# Patient Record
Sex: Female | Born: 1950 | Race: White | Hispanic: No | Marital: Married | Smoking: Former smoker
Health system: Southern US, Community
[De-identification: ages and names within clinical notes are randomized; demographics above are authoritative.]

## PROBLEM LIST (undated history)

## (undated) DIAGNOSIS — E7800 Pure hypercholesterolemia, unspecified: Secondary | ICD-10-CM

## (undated) DIAGNOSIS — M1990 Unspecified osteoarthritis, unspecified site: Secondary | ICD-10-CM

## (undated) HISTORY — PX: BREAST SURGERY: SHX581

## (undated) HISTORY — PX: TUBAL LIGATION: SHX77

## (undated) HISTORY — DX: Pure hypercholesterolemia, unspecified: E78.00

---

## 2001-01-09 ENCOUNTER — Ambulatory Visit (HOSPITAL_COMMUNITY): Admission: RE | Admit: 2001-01-09 | Discharge: 2001-01-09 | Payer: Self-pay | Admitting: Pulmonary Disease

## 2001-03-28 ENCOUNTER — Ambulatory Visit (HOSPITAL_COMMUNITY): Admission: RE | Admit: 2001-03-28 | Discharge: 2001-03-28 | Payer: Self-pay | Admitting: Pulmonary Disease

## 2001-09-25 ENCOUNTER — Other Ambulatory Visit: Admission: RE | Admit: 2001-09-25 | Discharge: 2001-09-25 | Payer: Self-pay | Admitting: Dermatology

## 2002-02-21 ENCOUNTER — Other Ambulatory Visit: Admission: RE | Admit: 2002-02-21 | Discharge: 2002-02-21 | Payer: Self-pay | Admitting: Obstetrics & Gynecology

## 2002-07-02 ENCOUNTER — Other Ambulatory Visit: Admission: RE | Admit: 2002-07-02 | Discharge: 2002-07-02 | Payer: Self-pay | Admitting: Obstetrics & Gynecology

## 2003-10-14 ENCOUNTER — Other Ambulatory Visit: Admission: RE | Admit: 2003-10-14 | Discharge: 2003-10-14 | Payer: Self-pay | Admitting: Obstetrics & Gynecology

## 2004-11-25 ENCOUNTER — Ambulatory Visit (HOSPITAL_COMMUNITY): Admission: RE | Admit: 2004-11-25 | Discharge: 2004-11-25 | Payer: Self-pay | Admitting: Internal Medicine

## 2004-11-25 ENCOUNTER — Encounter (INDEPENDENT_AMBULATORY_CARE_PROVIDER_SITE_OTHER): Payer: Self-pay | Admitting: Internal Medicine

## 2004-11-25 ENCOUNTER — Ambulatory Visit: Payer: Self-pay | Admitting: Internal Medicine

## 2005-02-10 ENCOUNTER — Ambulatory Visit: Payer: Self-pay | Admitting: Internal Medicine

## 2005-02-16 ENCOUNTER — Ambulatory Visit (HOSPITAL_COMMUNITY): Admission: RE | Admit: 2005-02-16 | Discharge: 2005-02-16 | Payer: Self-pay | Admitting: Internal Medicine

## 2005-04-06 ENCOUNTER — Ambulatory Visit (HOSPITAL_COMMUNITY): Admission: RE | Admit: 2005-04-06 | Discharge: 2005-04-06 | Payer: Self-pay | Admitting: Family Medicine

## 2005-07-16 ENCOUNTER — Ambulatory Visit (HOSPITAL_COMMUNITY): Admission: RE | Admit: 2005-07-16 | Discharge: 2005-07-16 | Payer: Self-pay | Admitting: Obstetrics and Gynecology

## 2005-08-11 ENCOUNTER — Ambulatory Visit (HOSPITAL_COMMUNITY): Admission: RE | Admit: 2005-08-11 | Discharge: 2005-08-11 | Payer: Self-pay | Admitting: Obstetrics and Gynecology

## 2005-09-16 ENCOUNTER — Ambulatory Visit (HOSPITAL_COMMUNITY): Admission: RE | Admit: 2005-09-16 | Discharge: 2005-09-16 | Payer: Self-pay | Admitting: Pulmonary Disease

## 2007-01-05 ENCOUNTER — Ambulatory Visit (HOSPITAL_COMMUNITY): Admission: RE | Admit: 2007-01-05 | Discharge: 2007-01-05 | Payer: Self-pay | Admitting: Obstetrics and Gynecology

## 2007-02-15 ENCOUNTER — Other Ambulatory Visit: Admission: RE | Admit: 2007-02-15 | Discharge: 2007-02-15 | Payer: Self-pay | Admitting: Obstetrics and Gynecology

## 2008-04-10 ENCOUNTER — Ambulatory Visit (HOSPITAL_COMMUNITY): Admission: RE | Admit: 2008-04-10 | Discharge: 2008-04-10 | Payer: Self-pay | Admitting: Obstetrics and Gynecology

## 2008-04-18 ENCOUNTER — Encounter: Admission: RE | Admit: 2008-04-18 | Discharge: 2008-04-18 | Payer: Self-pay | Admitting: Obstetrics and Gynecology

## 2008-04-24 ENCOUNTER — Other Ambulatory Visit: Admission: RE | Admit: 2008-04-24 | Discharge: 2008-04-24 | Payer: Self-pay | Admitting: Obstetrics and Gynecology

## 2009-04-28 ENCOUNTER — Ambulatory Visit (HOSPITAL_COMMUNITY): Admission: RE | Admit: 2009-04-28 | Discharge: 2009-04-28 | Payer: Self-pay | Admitting: Obstetrics and Gynecology

## 2009-05-21 ENCOUNTER — Other Ambulatory Visit: Admission: RE | Admit: 2009-05-21 | Discharge: 2009-05-21 | Payer: Self-pay | Admitting: Obstetrics and Gynecology

## 2010-07-24 NOTE — Op Note (Signed)
[name], [name]                  ACCOUNT NO.:  [id number]   MEDICAL RECORD NO.:  [id number]          PATIENT TYPE:  AMB   LOCATION:  DAY                           FACILITY:  [facility]   PHYSICIAN:  Lionel December, M.D.    DATE OF BIRTH:  01/03/1951   DATE OF PROCEDURE:  11/25/2004  DATE OF DISCHARGE:                                 OPERATIVE REPORT   PROCEDURE:  Colonoscopy.   INDICATIONS:  Nancy Velez is 60 year old Caucasian female who is here for  screening colonoscopy. Family history is negative for colon carcinoma in  first degree relatives.   Procedure and risks were reviewed with the patient, and informed consent was  obtained.   PREMEDICATION:  Demerol 50 mg IV, Versed 6 mg IV in divided dose.   FINDINGS:  Procedure performed in endoscopy suite. The patient's vital signs  and O2 saturation were monitored during the procedure and remained stable.  The patient was placed in left lateral position and rectal examination  performed. No abnormality noted on external or digital exam. Olympus  videoscope was placed in rectum and advanced under vision into sigmoid colon  and beyond. Redundant colon with satisfactory preparation. Scope was passed  to the cecum which was identified by appendiceal orifice and ileocecal  valve. Pictures taken for the record. As the scope was withdrawn, colonic  mucosa was carefully examined. Single small diverticula was noted at sigmoid  colon along with 4-mm polyp which was easily ablated via cold biopsy. Mucosa  of the rest of the sigmoid colon and rectum was normal. Scope was  retroflexed to examine anorectal junction, and small hemorrhoids were noted  below the dentate line. Endoscope was straightened and withdrawn. The  patient tolerated the procedure well.   IMPRESSION:  1.  A 4-mm polyp ablated via cold biopsy from distal sigmoid colon.  2.  Single diverticulum at sigmoid colon and small external hemorrhoids.   RECOMMENDATIONS:  Standard instructions  given. I will be contacting the  patient with biopsy results and further recommendations.      Lionel December, M.D.  Electronically Signed     NR/MEDQ  D:  11/25/2004  T:  11/25/2004  Job:  [id number]

## 2010-10-21 ENCOUNTER — Other Ambulatory Visit (HOSPITAL_COMMUNITY): Payer: Self-pay | Admitting: Family Medicine

## 2010-10-21 DIAGNOSIS — Z139 Encounter for screening, unspecified: Secondary | ICD-10-CM

## 2010-10-27 ENCOUNTER — Ambulatory Visit (HOSPITAL_COMMUNITY)
Admission: RE | Admit: 2010-10-27 | Discharge: 2010-10-27 | Disposition: A | Discharge status: Home / Self Care | Payer: 59 | Source: Ambulatory Visit | Attending: Family Medicine | Admitting: Family Medicine

## 2010-10-27 DIAGNOSIS — Z1231 Encounter for screening mammogram for malignant neoplasm of breast: Secondary | ICD-10-CM | POA: Insufficient documentation

## 2010-10-27 DIAGNOSIS — Z139 Encounter for screening, unspecified: Secondary | ICD-10-CM

## 2010-10-30 ENCOUNTER — Other Ambulatory Visit: Payer: Self-pay | Admitting: Family Medicine

## 2010-10-30 DIAGNOSIS — R928 Other abnormal and inconclusive findings on diagnostic imaging of breast: Secondary | ICD-10-CM

## 2010-11-03 ENCOUNTER — Other Ambulatory Visit: Payer: Self-pay | Admitting: Family Medicine

## 2010-11-03 DIAGNOSIS — R928 Other abnormal and inconclusive findings on diagnostic imaging of breast: Secondary | ICD-10-CM

## 2010-11-05 ENCOUNTER — Ambulatory Visit
Admission: RE | Admit: 2010-11-05 | Discharge: 2010-11-05 | Disposition: A | Discharge status: Home / Self Care | Payer: 59 | Source: Ambulatory Visit | Attending: Family Medicine | Admitting: Family Medicine

## 2010-11-05 DIAGNOSIS — R928 Other abnormal and inconclusive findings on diagnostic imaging of breast: Secondary | ICD-10-CM

## 2011-04-08 ENCOUNTER — Other Ambulatory Visit: Payer: Self-pay | Admitting: Obstetrics and Gynecology

## 2011-04-09 ENCOUNTER — Other Ambulatory Visit: Payer: Self-pay | Admitting: Obstetrics & Gynecology

## 2011-04-09 DIAGNOSIS — N63 Unspecified lump in unspecified breast: Secondary | ICD-10-CM

## 2011-04-15 ENCOUNTER — Ambulatory Visit
Admission: RE | Admit: 2011-04-15 | Discharge: 2011-04-15 | Disposition: A | Discharge status: Home / Self Care | Payer: 59 | Source: Ambulatory Visit | Attending: Obstetrics & Gynecology | Admitting: Obstetrics & Gynecology

## 2011-04-15 ENCOUNTER — Other Ambulatory Visit: Payer: Self-pay | Admitting: Obstetrics & Gynecology

## 2011-04-15 DIAGNOSIS — N63 Unspecified lump in unspecified breast: Secondary | ICD-10-CM

## 2011-05-26 ENCOUNTER — Other Ambulatory Visit (HOSPITAL_COMMUNITY)
Admission: RE | Admit: 2011-05-26 | Discharge: 2011-05-26 | Disposition: A | Discharge status: Home / Self Care | Payer: 59 | Source: Ambulatory Visit | Attending: Obstetrics and Gynecology | Admitting: Obstetrics and Gynecology

## 2011-05-26 ENCOUNTER — Other Ambulatory Visit: Payer: Self-pay | Admitting: Adult Health

## 2011-05-26 DIAGNOSIS — Z01419 Encounter for gynecological examination (general) (routine) without abnormal findings: Secondary | ICD-10-CM | POA: Insufficient documentation

## 2013-02-14 ENCOUNTER — Telehealth: Payer: Self-pay

## 2013-02-14 NOTE — Telephone Encounter (Signed)
I reviewed pt's paper chart and did not see what her blood type was. Advised when she had labs drawn next time, to have it checked then. Pt voiced understanding. JSY

## 2013-03-21 ENCOUNTER — Other Ambulatory Visit: Payer: Self-pay | Admitting: Adult Health

## 2013-03-28 ENCOUNTER — Other Ambulatory Visit (HOSPITAL_COMMUNITY)
Admission: RE | Admit: 2013-03-28 | Discharge: 2013-03-28 | Disposition: A | Discharge status: Home / Self Care | Payer: 59 | Source: Ambulatory Visit | Attending: Family Medicine | Admitting: Family Medicine

## 2013-03-28 ENCOUNTER — Ambulatory Visit (INDEPENDENT_AMBULATORY_CARE_PROVIDER_SITE_OTHER): Payer: 59 | Admitting: Adult Health

## 2013-03-28 ENCOUNTER — Encounter: Payer: Self-pay | Admitting: Adult Health

## 2013-03-28 VITALS — BP 132/70 | HR 72 | Ht 62.5 in | Wt 182.0 lb

## 2013-03-28 DIAGNOSIS — Z01419 Encounter for gynecological examination (general) (routine) without abnormal findings: Secondary | ICD-10-CM | POA: Insufficient documentation

## 2013-03-28 DIAGNOSIS — Z1212 Encounter for screening for malignant neoplasm of rectum: Secondary | ICD-10-CM

## 2013-03-28 DIAGNOSIS — Z1151 Encounter for screening for human papillomavirus (HPV): Secondary | ICD-10-CM | POA: Insufficient documentation

## 2013-03-28 DIAGNOSIS — E669 Obesity, unspecified: Secondary | ICD-10-CM | POA: Insufficient documentation

## 2013-03-28 LAB — HEMOCCULT GUIAC POC 1CARD (OFFICE): FECAL OCCULT BLD: NEGATIVE

## 2013-03-28 NOTE — Progress Notes (Signed)
Patient ID: Nancy Velez, female   DOB: 12-Oct-1950, 63 y.o.   MRN: [id number] History of Present Illness: Nancy Velez is a 63 year old white female, married,in for a pap and physical.No complaints, she does Zumba 2-3 x per week and is very active.Has gotten flu shot and pneumonia shot.   Current Medications, Allergies, Past Medical History, Past Surgical History, Family History and Social History were reviewed in [employer] record.     Review of Systems: Patient denies any headaches, blurred vision, shortness of breath, chest pain, abdominal pain, problems with bowel movements, urination, or intercourse. No joint pain or mood swings.    Physical Exam:BP 132/70  Pulse 72  Ht 5' 2.5" (1.588 m)  Wt 182 lb (82.555 kg)  BMI 32.74 kg/m2 General:  Well developed, well nourished, no acute distress Skin:  Warm and dry Neck:  Midline trachea, normal thyroid, no carotid bruits heard Lungs; Clear to auscultation bilaterally Breast:  No dominant palpable mass, retraction, or nipple discharge Cardiovascular: Regular rate and rhythm Abdomen:  Soft, non tender, no hepatosplenomegaly Pelvic:  External genitalia is normal in appearance.  The vagina is normal in appearance for age.               The cervix is smooth and atrophic, pap with HPV performed.  Uterus is felt to be normal size, shape, and contour.  No adnexal masses or tenderness noted. Rectal: Good sphincter tone, no polyps, or hemorrhoids felt.  Hemoccult negative. Extremities:  No swelling or varicosities noted Psych:  No mood changes, alert and cooperative, seems happy   Impression: Yearly gyn exam    Plan: Physical in 2 years Mammogram yearly  Colonoscopy per GI Labs next week, CBC,CMP,TSH and lipids

## 2013-03-28 NOTE — Patient Instructions (Signed)
Physical in 2 year Mammogram  Yearly  colonoscopy per GI Labs nest week

## 2013-04-04 ENCOUNTER — Other Ambulatory Visit: Payer: 59

## 2013-04-04 ENCOUNTER — Telehealth: Payer: Self-pay | Admitting: *Deleted

## 2013-04-04 DIAGNOSIS — Z13220 Encounter for screening for lipoid disorders: Secondary | ICD-10-CM

## 2013-04-04 DIAGNOSIS — Z1329 Encounter for screening for other suspected endocrine disorder: Secondary | ICD-10-CM

## 2013-04-04 DIAGNOSIS — Z0000 Encounter for general adult medical examination without abnormal findings: Secondary | ICD-10-CM

## 2013-04-04 LAB — COMPREHENSIVE METABOLIC PANEL
ALBUMIN: 4.2 g/dL (ref 3.5–5.2)
ALK PHOS: 86 U/L (ref 39–117)
ALT: 28 U/L (ref 0–35)
AST: 20 U/L (ref 0–37)
BILIRUBIN TOTAL: 0.5 mg/dL (ref 0.2–1.2)
BUN: 15 mg/dL (ref 6–23)
CHLORIDE: 101 meq/L (ref 96–112)
CO2: 32 mEq/L (ref 19–32)
Calcium: 9.8 mg/dL (ref 8.4–10.5)
Creat: 0.62 mg/dL (ref 0.50–1.10)
Glucose, Bld: 91 mg/dL (ref 70–99)
Potassium: 5.4 mEq/L — ABNORMAL HIGH (ref 3.5–5.3)
SODIUM: 141 meq/L (ref 135–145)
Total Protein: 7.3 g/dL (ref 6.0–8.3)

## 2013-04-04 LAB — CBC
HEMATOCRIT: 41.4 % (ref 36.0–46.0)
HEMOGLOBIN: 14 g/dL (ref 12.0–15.0)
MCH: 29.8 pg (ref 26.0–34.0)
MCHC: 33.8 g/dL (ref 30.0–36.0)
MCV: 88.1 fL (ref 78.0–100.0)
PLATELETS: 378 10*3/uL (ref 150–400)
RBC: 4.7 MIL/uL (ref 3.87–5.11)
RDW: 14.7 % (ref 11.5–15.5)
WBC: 6.7 10*3/uL (ref 4.0–10.5)

## 2013-04-04 LAB — LIPID PANEL
CHOLESTEROL: 260 mg/dL — AB (ref 0–200)
HDL: 85 mg/dL (ref >39)
LDL Cholesterol: 157 mg/dL — ABNORMAL HIGH (ref 0–99)
Total CHOL/HDL Ratio: 3.1 Ratio
Triglycerides: 91 mg/dL (ref <150)
VLDL: 18 mg/dL (ref 0–40)

## 2013-04-04 LAB — TSH: TSH: 2.982 u[IU]/mL (ref 0.350–4.500)

## 2013-04-05 ENCOUNTER — Telehealth: Payer: Self-pay | Admitting: Adult Health

## 2013-04-05 NOTE — Telephone Encounter (Signed)
Pt aware of labs, stay active watch diet.will mail copy to pt.

## 2013-04-09 ENCOUNTER — Other Ambulatory Visit: Payer: Self-pay

## 2013-04-09 DIAGNOSIS — Z9889 Other specified postprocedural states: Secondary | ICD-10-CM

## 2013-04-09 DIAGNOSIS — Z1231 Encounter for screening mammogram for malignant neoplasm of breast: Secondary | ICD-10-CM

## 2013-04-10 NOTE — Telephone Encounter (Signed)
Labs mailed per front staff.

## 2013-04-19 ENCOUNTER — Encounter: Payer: Self-pay | Admitting: Cardiovascular Disease

## 2013-04-19 ENCOUNTER — Ambulatory Visit (INDEPENDENT_AMBULATORY_CARE_PROVIDER_SITE_OTHER): Payer: 59 | Admitting: Cardiovascular Disease

## 2013-04-19 VITALS — BP 126/80 | HR 100 | Ht 63.0 in | Wt 183.0 lb

## 2013-04-19 DIAGNOSIS — R0781 Pleurodynia: Secondary | ICD-10-CM

## 2013-04-19 DIAGNOSIS — R0789 Other chest pain: Secondary | ICD-10-CM

## 2013-04-19 DIAGNOSIS — E7800 Pure hypercholesterolemia, unspecified: Secondary | ICD-10-CM

## 2013-04-19 DIAGNOSIS — R071 Chest pain on breathing: Secondary | ICD-10-CM

## 2013-04-19 HISTORY — DX: Pure hypercholesterolemia, unspecified: E78.00

## 2013-04-19 NOTE — Patient Instructions (Signed)
Dr. Sallyanne Kuster has requested that you have an echocardiogram. Echocardiography is a painless test that uses sound waves to create images of your heart. It provides your doctor with information about the size and shape of your heart and how well your heart's chambers and valves are working. This procedure takes approximately one hour. There are no restrictions for this procedure.  Dr. Sallyanne Kuster has requested that you have an exercise tolerance test. For further information please visit [email or web address]. Please also follow instruction sheet, as given.  We will call you with your test results.

## 2013-04-19 NOTE — Progress Notes (Signed)
Patient ID: Nancy Velez, female   DOB: [birth year], 63 y.o.   MRN: [id number]      Reason for office visit Chest pain  Nancy Velez is married to Nancy Velez, who is also one of our patients. She has not had any previous cardiac problems. Last week she experienced an episode of chest discomfort that lasted for at least an hour. She describes as a simultaneously sharp and heavy sensation underneath her left breast. It was distinctly worsened by taking a deep breath. She has been under a lot of psychological stress as her mother has been diagnosed with lung cancer and she tried to take an anti-anxiety medication but this did not help. Eventually she "slept it off". She has not had any more chest discomfort. Typically she is physically very active and participates in is in the classes several days a week. She appears very muscular and energetic although she is also obese.  The chest discomfort was not associated with fever or chills, cough, hemoptysis, lower extremity swelling or tenderness, flulike symptoms or any other complaints. She has not had palpitations or syncope.  She recently discovered that she has elevated cholesterol and LDL cholesterol, but does not have other coronary risk factors     Allergies  Allergen Reactions  . Shellfish Allergy Nausea And Vomiting    Current Outpatient Prescriptions  Medication Sig Dispense Refill  . ibuprofen (ADVIL,MOTRIN) 200 MG tablet Take 200 mg by mouth as needed.       No current facility-administered medications for this visit.    No past medical history problems  Past Surgical History  Procedure Laterality Date  . Breast surgery      lumpectomy  . Tubal ligation      Family History  Problem Relation Age of Onset  . Cancer Mother     lung  . Cancer Father     prostate  . Cancer Maternal Aunt     brain  . Heart disease Maternal Uncle   . Cancer Maternal Grandmother     pancreatic   . Heart disease Maternal Grandfather   . Heart  disease Paternal Grandmother     heart attack  . Heart disease Paternal Grandfather   . Heart disease Maternal Uncle   . Heart disease Maternal Aunt   . Heart disease Maternal Aunt     History   Social History  . Marital Status: Married    Spouse Name: N/A    Number of Children: N/A  . Years of Education: N/A   Occupational History  . Not on file.   Social History Main Topics  . Smoking status: Former Research scientist (life sciences)  . Smokeless tobacco: Never Used  . Alcohol Use: Yes     Comment: wine occassionally  . Drug Use: No  . Sexual Activity: Yes    Birth Control/ Protection: Post-menopausal, Surgical   Other Topics Concern  . Not on file   Social History Narrative  . No narrative on file    Review of systems: The patient specifically denies dyspnea at rest or with exertion, orthopnea, paroxysmal nocturnal dyspnea, syncope, palpitations, focal neurological deficits, intermittent claudication, lower extremity edema, unexplained weight gain, cough, hemoptysis or wheezing.  The patient also denies abdominal pain, nausea, vomiting, dysphagia, diarrhea, constipation, polyuria, polydipsia, dysuria, hematuria, frequency, urgency, abnormal bleeding or bruising, fever, chills, unexpected weight changes, mood swings, change in skin or hair texture, change in voice quality, auditory or visual problems, allergic reactions or rashes, new musculoskeletal complaints other than usual "  aches and pains".    PHYSICAL EXAM BP 126/80  Pulse 100  Ht 5\' 3"  (1.6 m)  Wt 83.008 kg (183 lb)  BMI 32.43 kg/m2  General: Alert, oriented x3, no distress Head: no evidence of trauma, PERRL, EOMI, no exophtalmos or lid lag, no myxedema, no xanthelasma; normal ears, nose and oropharynx Neck: normal jugular venous pulsations and no hepatojugular reflux; brisk carotid pulses without delay and no carotid bruits Chest: clear to auscultation, no signs of consolidation by percussion or palpation, normal fremitus,  symmetrical and full respiratory excursions Cardiovascular: normal position and quality of the apical impulse, regular rhythm, normal first and second heart sounds, no murmurs, rubs or gallops Abdomen: no tenderness or distention, no masses by palpation, no abnormal pulsatility or arterial bruits, normal bowel sounds, no hepatosplenomegaly Extremities: no clubbing, cyanosis or edema; 2+ radial, ulnar and brachial pulses bilaterally; 2+ right femoral, posterior tibial and dorsalis pedis pulses; 2+ left femoral, posterior tibial and dorsalis pedis pulses; no subclavian or femoral bruits Neurological: grossly nonfocal   EKG: Normal sinus rhythm, possible right atrial abnormality, normal QRS voltage in axis, delayed R wave progression across the anterior precordium, there is an S wave in lead 1 but there is no Q wave in lead 3  Lipid Panel     Component Value Date/Time   CHOL 260* 04/04/2013 0915   TRIG 91 04/04/2013 0915   HDL 85 04/04/2013 0915   CHOLHDL 3.1 04/04/2013 0915   VLDL 18 04/04/2013 0915   LDLCALC 157* 04/04/2013 0915    BMET    Component Value Date/Time   NA 141 04/04/2013 0915   K 5.4* 04/04/2013 0915   CL 101 04/04/2013 0915   CO2 32 04/04/2013 0915   GLUCOSE 91 04/04/2013 0915   BUN 15 04/04/2013 0915   CREATININE 0.62 04/04/2013 0915   CALCIUM 9.8 04/04/2013 0915     ASSESSMENT AND PLAN Pleuritic chest pain Her chest pain does not sound coronary in etiology. I would be more concerned about pulmonary infarction, pneumonia, pericarditis, but there is very little to support any of these diagnoses by either history or physical exam. A week has passed since her symptoms happened and now not sure that a chest x-ray makes much sense at this point, especially since chest x-ray findings and those orders are typically very nonspecific. I am intrigued by the presence of a prominent P-wave on the electrocardiogram in lead 2 and lead aVF. This raises the possibility of right heart strain. I  have recommended that she have an echocardiogram. If right heart enlargement is confirmed I would pursue workup for pulmonary hypertension/venous thromboembolic disease. Even though the symptoms would be highly atypical, in view of her hypercholesterolemia and age it is very reasonable to perform a treadmill stress test. After those 2 tests are performed we will reevaluate the need for any additional workup. If they are normal and her symptoms have completely resolved I'm not sure of go any further.  Hypercholesterolemia Her cholesterol and especially her LDL cholesterol quite elevated. She will require at least lifestyle changes and a reevaluation in a few months. She is very close to the range mood recommend statin therapy given the absence of known vascular disease. If her stress test is abnormal and there is any evidence of vascular disease, I would not hesitate to start a statin now.   Orders Placed This Encounter  Procedures  . EKG 12-Lead  . Exercise Tolerance Test  . 2D Echocardiogram without contrast   Patient  Instructions  Dr. Sallyanne Kuster has requested that you have an echocardiogram. Echocardiography is a painless test that uses sound waves to create images of your heart. It provides your doctor with information about the size and shape of your heart and how well your heart's chambers and valves are working. This procedure takes approximately one hour. There are no restrictions for this procedure.  Dr. Sallyanne Kuster has requested that you have an exercise tolerance test. For further information please visit [email or web address]. Please also follow instruction sheet, as given.  We will call you with your test results.     Holli Humbles, MD, [place] [phone] office [phone] pager

## 2013-04-19 NOTE — Assessment & Plan Note (Signed)
Her chest pain does not sound coronary in etiology. I would be more concerned about pulmonary infarction, pneumonia, pericarditis, but there is very little to support any of these diagnoses by either history or physical exam. A week has passed since her symptoms happened and now not sure that a chest x-ray makes much sense at this point, especially since chest x-ray findings and those orders are typically very nonspecific. I am intrigued by the presence of a prominent P-wave on the electrocardiogram in lead 2 and lead aVF. This raises the possibility of right heart strain. I have recommended that she have an echocardiogram. If right heart enlargement is confirmed I would pursue workup for pulmonary hypertension/venous thromboembolic disease. Even though the symptoms would be highly atypical, in view of her hypercholesterolemia and age it is very reasonable to perform a treadmill stress test. After those 2 tests are performed we will reevaluate the need for any additional workup. If they are normal and her symptoms have completely resolved I'm not sure of go any further.

## 2013-04-19 NOTE — Assessment & Plan Note (Signed)
Her cholesterol and especially her LDL cholesterol quite elevated. She will require at least lifestyle changes and a reevaluation in a few months. She is very close to the range mood recommend statin therapy given the absence of known vascular disease. If her stress test is abnormal and there is any evidence of vascular disease, I would not hesitate to start a statin now.

## 2013-04-20 ENCOUNTER — Ambulatory Visit
Admission: RE | Admit: 2013-04-20 | Discharge: 2013-04-20 | Disposition: A | Discharge status: Home / Self Care | Payer: No Typology Code available for payment source | Source: Ambulatory Visit

## 2013-04-20 ENCOUNTER — Other Ambulatory Visit: Payer: Self-pay

## 2013-04-20 DIAGNOSIS — Z1231 Encounter for screening mammogram for malignant neoplasm of breast: Secondary | ICD-10-CM

## 2013-04-20 DIAGNOSIS — Z9889 Other specified postprocedural states: Secondary | ICD-10-CM

## 2013-04-27 ENCOUNTER — Encounter: Payer: Self-pay | Admitting: Cardiovascular Disease

## 2013-05-03 ENCOUNTER — Encounter (HOSPITAL_COMMUNITY): Payer: 59

## 2013-05-07 ENCOUNTER — Ambulatory Visit (HOSPITAL_COMMUNITY)
Admission: RE | Admit: 2013-05-07 | Discharge: 2013-05-07 | Disposition: A | Discharge status: Home / Self Care | Payer: 59 | Source: Ambulatory Visit | Attending: Internal Medicine | Admitting: Internal Medicine

## 2013-05-07 DIAGNOSIS — R0789 Other chest pain: Secondary | ICD-10-CM | POA: Insufficient documentation

## 2013-05-07 DIAGNOSIS — R072 Precordial pain: Secondary | ICD-10-CM

## 2013-05-07 NOTE — Progress Notes (Signed)
2D Echo Performed 05/07/2013    Destiney Sanabia, RCS  

## 2013-05-17 ENCOUNTER — Ambulatory Visit (HOSPITAL_COMMUNITY)
Admission: RE | Admit: 2013-05-17 | Discharge: 2013-05-17 | Disposition: A | Discharge status: Home / Self Care | Payer: 59 | Source: Ambulatory Visit | Attending: Cardiovascular Disease | Admitting: Cardiovascular Disease

## 2013-05-17 DIAGNOSIS — R0789 Other chest pain: Secondary | ICD-10-CM

## 2013-05-17 DIAGNOSIS — R079 Chest pain, unspecified: Secondary | ICD-10-CM | POA: Insufficient documentation

## 2013-05-18 ENCOUNTER — Telehealth: Payer: Self-pay | Admitting: *Deleted

## 2013-05-18 NOTE — Telephone Encounter (Signed)
Normal stress test results called to patient.  Voiced understanding.

## 2013-05-18 NOTE — Telephone Encounter (Signed)
Message copied by Tressa Busman on Fri May 18, 2013  2:56 PM ------      Message from: Sanda Klein      Created: Fri May 18, 2013  9:51 AM       Normal stress test ------

## 2013-09-05 DIAGNOSIS — Z23 Encounter for immunization: Secondary | ICD-10-CM | POA: Diagnosis not present

## 2013-11-07 ENCOUNTER — Other Ambulatory Visit (HOSPITAL_COMMUNITY): Payer: Self-pay | Admitting: Family Medicine

## 2013-11-07 DIAGNOSIS — Z01419 Encounter for gynecological examination (general) (routine) without abnormal findings: Secondary | ICD-10-CM | POA: Diagnosis not present

## 2013-11-07 DIAGNOSIS — Z6832 Body mass index (BMI) 32.0-32.9, adult: Secondary | ICD-10-CM | POA: Diagnosis not present

## 2013-11-07 DIAGNOSIS — J309 Allergic rhinitis, unspecified: Secondary | ICD-10-CM | POA: Diagnosis not present

## 2013-11-08 ENCOUNTER — Other Ambulatory Visit (HOSPITAL_COMMUNITY): Payer: Self-pay | Admitting: Family Medicine

## 2013-11-08 DIAGNOSIS — Z13820 Encounter for screening for osteoporosis: Secondary | ICD-10-CM

## 2013-11-15 ENCOUNTER — Other Ambulatory Visit (HOSPITAL_COMMUNITY): Payer: 59

## 2013-12-25 ENCOUNTER — Other Ambulatory Visit (HOSPITAL_COMMUNITY): Payer: 59

## 2013-12-26 ENCOUNTER — Ambulatory Visit (HOSPITAL_COMMUNITY)
Admission: RE | Admit: 2013-12-26 | Discharge: 2013-12-26 | Disposition: A | Discharge status: Home / Self Care | Payer: Medicare Other | Source: Ambulatory Visit | Attending: Family Medicine | Admitting: Family Medicine

## 2013-12-26 ENCOUNTER — Other Ambulatory Visit (HOSPITAL_COMMUNITY): Payer: Self-pay | Admitting: Family Medicine

## 2013-12-26 DIAGNOSIS — E2839 Other primary ovarian failure: Secondary | ICD-10-CM

## 2013-12-26 DIAGNOSIS — Z23 Encounter for immunization: Secondary | ICD-10-CM | POA: Diagnosis not present

## 2013-12-26 DIAGNOSIS — Z780 Asymptomatic menopausal state: Secondary | ICD-10-CM | POA: Diagnosis not present

## 2013-12-26 DIAGNOSIS — Z13820 Encounter for screening for osteoporosis: Secondary | ICD-10-CM | POA: Insufficient documentation

## 2013-12-26 DIAGNOSIS — D510 Vitamin B12 deficiency anemia due to intrinsic factor deficiency: Secondary | ICD-10-CM | POA: Diagnosis not present

## 2014-01-07 ENCOUNTER — Encounter: Payer: Self-pay | Admitting: Cardiovascular Disease

## 2014-03-18 DIAGNOSIS — Z1283 Encounter for screening for malignant neoplasm of skin: Secondary | ICD-10-CM | POA: Diagnosis not present

## 2014-04-17 DIAGNOSIS — J069 Acute upper respiratory infection, unspecified: Secondary | ICD-10-CM | POA: Diagnosis not present

## 2014-04-17 DIAGNOSIS — Z6832 Body mass index (BMI) 32.0-32.9, adult: Secondary | ICD-10-CM | POA: Diagnosis not present

## 2014-06-07 DIAGNOSIS — E669 Obesity, unspecified: Secondary | ICD-10-CM | POA: Diagnosis not present

## 2014-06-07 DIAGNOSIS — E6609 Other obesity due to excess calories: Secondary | ICD-10-CM | POA: Diagnosis not present

## 2014-06-07 DIAGNOSIS — Z6833 Body mass index (BMI) 33.0-33.9, adult: Secondary | ICD-10-CM | POA: Diagnosis not present

## 2014-06-07 DIAGNOSIS — Z0001 Encounter for general adult medical examination with abnormal findings: Secondary | ICD-10-CM | POA: Diagnosis not present

## 2014-07-23 ENCOUNTER — Other Ambulatory Visit: Payer: Self-pay

## 2014-07-23 DIAGNOSIS — Z1231 Encounter for screening mammogram for malignant neoplasm of breast: Secondary | ICD-10-CM

## 2014-08-16 ENCOUNTER — Ambulatory Visit
Admission: RE | Admit: 2014-08-16 | Discharge: 2014-08-16 | Disposition: A | Discharge status: Home / Self Care | Payer: Medicare Other | Source: Ambulatory Visit

## 2014-08-16 DIAGNOSIS — Z1231 Encounter for screening mammogram for malignant neoplasm of breast: Secondary | ICD-10-CM

## 2014-08-20 ENCOUNTER — Telehealth: Payer: Self-pay | Admitting: Cardiovascular Disease

## 2014-08-20 ENCOUNTER — Other Ambulatory Visit: Payer: Self-pay | Admitting: Obstetrics and Gynecology

## 2014-08-20 DIAGNOSIS — R928 Other abnormal and inconclusive findings on diagnostic imaging of breast: Secondary | ICD-10-CM

## 2014-08-20 NOTE — Telephone Encounter (Signed)
Called, goes straight to answering machine - left message requesting pt to call back.

## 2014-08-20 NOTE — Telephone Encounter (Signed)
Pt c/o swelling: STAT is pt has developed SOB within 24 hours  1. How long have you been experiencing swelling? Last couple of weeks   2. Where is the swelling located? Both feet   3.  Are you currently taking a "fluid pill"? Furosemide   4.  Are you currently SOB? Pt denies having any SOB right now  5.  Have you traveled recently? No

## 2014-08-23 ENCOUNTER — Ambulatory Visit
Admission: RE | Admit: 2014-08-23 | Discharge: 2014-08-23 | Disposition: A | Discharge status: Home / Self Care | Payer: Medicare Other | Source: Ambulatory Visit | Attending: Obstetrics and Gynecology | Admitting: Obstetrics and Gynecology

## 2014-08-23 DIAGNOSIS — R928 Other abnormal and inconclusive findings on diagnostic imaging of breast: Secondary | ICD-10-CM

## 2014-08-23 DIAGNOSIS — R920 Mammographic microcalcification found on diagnostic imaging of breast: Secondary | ICD-10-CM | POA: Diagnosis not present

## 2014-08-23 NOTE — Telephone Encounter (Signed)
Spoke with pt, her SOB has gotten better. She does have an appt with dr croitoru on Monday to discuss the swelling in her legs.

## 2014-08-26 ENCOUNTER — Ambulatory Visit (INDEPENDENT_AMBULATORY_CARE_PROVIDER_SITE_OTHER): Payer: Medicare Other | Admitting: Cardiovascular Disease

## 2014-08-26 ENCOUNTER — Telehealth: Payer: Self-pay | Admitting: *Deleted

## 2014-08-26 ENCOUNTER — Encounter: Payer: Self-pay | Admitting: Cardiovascular Disease

## 2014-08-26 VITALS — HR 84 | Ht 63.0 in | Wt 202.7 lb

## 2014-08-26 DIAGNOSIS — Z79899 Other long term (current) drug therapy: Secondary | ICD-10-CM

## 2014-08-26 DIAGNOSIS — E7800 Pure hypercholesterolemia, unspecified: Secondary | ICD-10-CM

## 2014-08-26 DIAGNOSIS — R609 Edema, unspecified: Secondary | ICD-10-CM

## 2014-08-26 DIAGNOSIS — E785 Hyperlipidemia, unspecified: Secondary | ICD-10-CM

## 2014-08-26 MED ORDER — ATORVASTATIN CALCIUM 20 MG PO TABS: 20.0000 mg | ORAL_TABLET | Freq: Every day | ORAL | Status: DC

## 2014-08-26 NOTE — Telephone Encounter (Signed)
Patient notified Dr. Loletha Grayer review labs done through [facility] and has prescribed Atorvastatin 20mg  daily and to have labs rechecked in 3 months.  Patient voiced understanding.  Rx sent to [facility].

## 2014-08-26 NOTE — Progress Notes (Signed)
Patient ID: Nancy Velez, female   DOB: Dec 30, 1950, 64 y.o.   MRN: [id number]     Cardiology Office Note   Date:  08/26/2014   ID:  Nancy Velez, DOB 1950-03-12, MRN [id number]  PCP:  Jana Half  Cardiologist:   Sanda Klein, MD   Chief Complaint  Patient presents with  . Annual Exam    patient reports occasional shortness of breath on exertion, swelling in ankles.      History of Present Illness: Nancy Velez is a 64 y.o. female who presents for follow-up of hyperlipidemia. She had lab tests performed a few weeks ago at [facility] but unfortunately these are not yet available for my review. She remains physically very active but has been "under stress". Her mother passed away several months ago. She has been eating more comfort food and unfortunately has gained weight. She denies any cardiovascular complaints either at rest or with exertion. She has occasional ankle swelling if she sits or stands without moving for a long period of time.    Past Medical History  Diagnosis Date  . Hypercholesterolemia 04/19/2013    Past Surgical History  Procedure Laterality Date  . Breast surgery      lumpectomy  . Tubal ligation       Current Outpatient Prescriptions  Medication Sig Dispense Refill  . naproxen sodium (ALEVE) 220 MG tablet Take 220 mg by mouth daily as needed.     No current facility-administered medications for this visit.    Allergies:   Shellfish allergy    Social History:  The patient  reports that she has quit smoking. She has never used smokeless tobacco. She reports that she drinks alcohol. She reports that she does not use illicit drugs.   Family History:  The patient's family history includes Cancer in her father, maternal aunt, maternal grandmother, and mother; Heart disease in her maternal aunt, maternal aunt, maternal grandfather, maternal uncle, maternal uncle, paternal grandfather, and paternal grandmother.    ROS:  Please see the  history of present illness.    Otherwise, review of systems positive for none.   All other systems are reviewed and negative.    PHYSICAL EXAM: VS:  Pulse 84  Ht 5\' 3"  (1.6 m)  Wt 202 lb 11.2 oz (91.944 kg)  BMI 35.92 kg/m2 , BMI Body mass index is 35.92 kg/(m^2).  General: Alert, oriented x3, no distress Head: no evidence of trauma, PERRL, EOMI, no exophtalmos or lid lag, no myxedema, no xanthelasma; normal ears, nose and oropharynx Neck: normal jugular venous pulsations and no hepatojugular reflux; brisk carotid pulses without delay and no carotid bruits Chest: clear to auscultation, no signs of consolidation by percussion or palpation, normal fremitus, symmetrical and full respiratory excursions Cardiovascular: normal position and quality of the apical impulse, regular rhythm, normal first and second heart sounds, no murmurs, rubs or gallops Abdomen: no tenderness or distention, no masses by palpation, no abnormal pulsatility or arterial bruits, normal bowel sounds, no hepatosplenomegaly Extremities: no clubbing, cyanosis or edema; 2+ radial, ulnar and brachial pulses bilaterally; 2+ right femoral, posterior tibial and dorsalis pedis pulses; 2+ left femoral, posterior tibial and dorsalis pedis pulses; no subclavian or femoral bruits Neurological: grossly nonfocal Psych: euthymic mood, full affect   EKG:  EKG is ordered today. The ekg ordered today demonstrates normal sinus rhythm   Recent Labs: No results found for requested labs within last 365 days.    Lipid Panel    Component Value Date/Time  CHOL 260* 04/04/2013 0915   TRIG 91 04/04/2013 0915   HDL 85 04/04/2013 0915   CHOLHDL 3.1 04/04/2013 0915   VLDL 18 04/04/2013 0915   LDLCALC 157* 04/04/2013 0915      Wt Readings from Last 3 Encounters:  08/26/14 202 lb 11.2 oz (91.944 kg)  04/19/13 183 lb (83.008 kg)  03/28/13 182 lb (82.555 kg)      ASSESSMENT AND PLAN:  Suspect that we will see worsening of her  lipid profile since she has gained substantial weight. She had no evidence of structural heart disease by echocardiography and stress testing performed a year ago. Target LDL-C less than 100 mg/dL, but would not start statin unless LDL greater than 1:30.   Current medicines are reviewed at length with the patient today.  The patient does not have concerns regarding medicines.  The following changes have been made:  no change  Labs/ tests ordered today include:  Orders Placed This Encounter  Procedures  . EKG 12-Lead    . Patient Instructions  Dr Sallyanne Kuster recommends that you schedule a follow-up appointment in 1 year. You will receive a reminder letter in the mail two months in advance. If you don't receive a letter, please call our office to schedule the follow-up appointment.     Mikael Spray, MD  08/26/2014 9:09 AM    Sanda Klein, MD, [facility] [phone] office [phone] pager

## 2014-08-26 NOTE — Patient Instructions (Signed)
Dr Croitoru recommends that you schedule a follow-up appointment in 1 year. You will receive a reminder letter in the mail two months in advance. If you don't receive a letter, please call our office to schedule the follow-up appointment. 

## 2014-10-01 ENCOUNTER — Ambulatory Visit: Payer: Medicare Other | Admitting: Cardiology

## 2014-11-13 ENCOUNTER — Encounter (INDEPENDENT_AMBULATORY_CARE_PROVIDER_SITE_OTHER): Payer: Self-pay | Admitting: *Deleted

## 2014-11-26 DIAGNOSIS — Z79899 Other long term (current) drug therapy: Secondary | ICD-10-CM | POA: Diagnosis not present

## 2014-11-26 DIAGNOSIS — E785 Hyperlipidemia, unspecified: Secondary | ICD-10-CM | POA: Diagnosis not present

## 2014-11-27 ENCOUNTER — Telehealth: Payer: Self-pay | Admitting: *Deleted

## 2014-11-27 DIAGNOSIS — Z79899 Other long term (current) drug therapy: Secondary | ICD-10-CM

## 2014-11-27 DIAGNOSIS — E785 Hyperlipidemia, unspecified: Secondary | ICD-10-CM

## 2014-11-27 LAB — LIPID PANEL
CHOL/HDL RATIO: 3.5 ratio (ref <=5.0)
Cholesterol: 202 mg/dL — ABNORMAL HIGH (ref 125–200)
HDL: 58 mg/dL (ref >=46)
LDL CALC: 118 mg/dL (ref <130)
Triglycerides: 130 mg/dL (ref <150)
VLDL: 26 mg/dL (ref <30)

## 2014-11-27 LAB — COMPREHENSIVE METABOLIC PANEL
ALBUMIN: 4.2 g/dL (ref 3.6–5.1)
ALT: 30 U/L — ABNORMAL HIGH (ref 6–29)
AST: 25 U/L (ref 10–35)
Alkaline Phosphatase: 84 U/L (ref 33–130)
BILIRUBIN TOTAL: 0.5 mg/dL (ref 0.2–1.2)
BUN: 14 mg/dL (ref 7–25)
CO2: 31 mmol/L (ref 20–31)
CREATININE: 0.5 mg/dL (ref 0.50–0.99)
Calcium: 9.5 mg/dL (ref 8.6–10.4)
Chloride: 101 mmol/L (ref 98–110)
Glucose, Bld: 95 mg/dL (ref 65–99)
Potassium: 4.3 mmol/L (ref 3.5–5.3)
SODIUM: 140 mmol/L (ref 135–146)
TOTAL PROTEIN: 7.1 g/dL (ref 6.1–8.1)

## 2014-11-27 MED ORDER — ATORVASTATIN CALCIUM 40 MG PO TABS: 40.0000 mg | ORAL_TABLET | Freq: Every day | ORAL | Status: DC

## 2014-11-27 NOTE — Telephone Encounter (Signed)
-----   Message from Sanda Klein, MD sent at 11/27/2014  8:50 AM EDT ----- Lipids much improved, although not quite at target yet. Please increase atorvastatin to 40 mg daily and recheck in 6 months. Copy PCP

## 2014-11-27 NOTE — Telephone Encounter (Signed)
Patient notified of lab results and to increase atorvastatin to 40mg  daily.  New Rx sent to pharmacy and lab order placed for recheck in 6 months.  Patient voiced understanding.

## 2014-12-19 DIAGNOSIS — Z23 Encounter for immunization: Secondary | ICD-10-CM | POA: Diagnosis not present

## 2015-01-02 ENCOUNTER — Other Ambulatory Visit (INDEPENDENT_AMBULATORY_CARE_PROVIDER_SITE_OTHER): Payer: Self-pay | Admitting: *Deleted

## 2015-01-02 DIAGNOSIS — Z800 Family history of malignant neoplasm of digestive organs: Secondary | ICD-10-CM

## 2015-01-02 DIAGNOSIS — Z1211 Encounter for screening for malignant neoplasm of colon: Secondary | ICD-10-CM

## 2015-01-07 ENCOUNTER — Telehealth (INDEPENDENT_AMBULATORY_CARE_PROVIDER_SITE_OTHER): Payer: Self-pay | Admitting: *Deleted

## 2015-01-07 DIAGNOSIS — Z1211 Encounter for screening for malignant neoplasm of colon: Secondary | ICD-10-CM

## 2015-01-07 NOTE — Telephone Encounter (Signed)
Patient needs trilyte 

## 2015-01-13 MED ORDER — PEG 3350-KCL-NA BICARB-NACL 420 G PO SOLR: 4000.0000 mL | Freq: Once | ORAL | Status: DC

## 2015-01-20 DIAGNOSIS — L309 Dermatitis, unspecified: Secondary | ICD-10-CM | POA: Diagnosis not present

## 2015-01-20 DIAGNOSIS — D239 Other benign neoplasm of skin, unspecified: Secondary | ICD-10-CM | POA: Diagnosis not present

## 2015-01-21 ENCOUNTER — Other Ambulatory Visit: Payer: Self-pay | Admitting: Obstetrics and Gynecology

## 2015-01-21 DIAGNOSIS — R921 Mammographic calcification found on diagnostic imaging of breast: Secondary | ICD-10-CM

## 2015-01-22 ENCOUNTER — Encounter (INDEPENDENT_AMBULATORY_CARE_PROVIDER_SITE_OTHER): Payer: Self-pay | Admitting: *Deleted

## 2015-01-22 ENCOUNTER — Telehealth (INDEPENDENT_AMBULATORY_CARE_PROVIDER_SITE_OTHER): Payer: Self-pay | Admitting: *Deleted

## 2015-01-22 NOTE — Telephone Encounter (Signed)
Referring MD/PCP: Delman Cheadle @ [facility]   Procedure: tcs  Reason/Indication:  Screening, fam hx colon ca  Has patient had this procedure before?  Yes, 2006 -- epic  If so, when, by whom and where?    Is there a family history of colon cancer?  Yes, maternal grandmother  Who?  What age when diagnosed?    Is patient diabetic?   no      Does patient have prosthetic heart valve or mechanical valve?  no  Do you have a pacemaker?  no  Has patient ever had endocarditis? no  Has patient had joint replacement within last 12 months?  no  Does patient tend to be constipated or take laxatives? no  Does patient have a history of alcohol/drug use?  no  Is patient on Coumadin, Plavix and/or Aspirin? no  Medications: aleve prn  Allergies: nkda  Medication Adjustment:   Procedure date & time: 02/10/15 at 825

## 2015-01-24 NOTE — Telephone Encounter (Signed)
agree

## 2015-03-04 ENCOUNTER — Ambulatory Visit (HOSPITAL_COMMUNITY)
Admission: RE | Admit: 2015-03-04 | Discharge: 2015-03-04 | Disposition: A | Discharge status: Home / Self Care | Payer: Medicare Other | Source: Ambulatory Visit | Attending: Internal Medicine | Admitting: Internal Medicine

## 2015-03-04 ENCOUNTER — Encounter (HOSPITAL_COMMUNITY)
Admission: RE | Disposition: A | Discharge status: Home / Self Care | Payer: Self-pay | Source: Ambulatory Visit | Attending: Internal Medicine

## 2015-03-04 ENCOUNTER — Encounter (HOSPITAL_COMMUNITY): Payer: Self-pay | Admitting: *Deleted

## 2015-03-04 DIAGNOSIS — E7800 Pure hypercholesterolemia, unspecified: Secondary | ICD-10-CM | POA: Diagnosis not present

## 2015-03-04 DIAGNOSIS — Z7982 Long term (current) use of aspirin: Secondary | ICD-10-CM | POA: Diagnosis not present

## 2015-03-04 DIAGNOSIS — K5730 Diverticulosis of large intestine without perforation or abscess without bleeding: Secondary | ICD-10-CM | POA: Diagnosis not present

## 2015-03-04 DIAGNOSIS — Z1211 Encounter for screening for malignant neoplasm of colon: Secondary | ICD-10-CM | POA: Diagnosis not present

## 2015-03-04 DIAGNOSIS — Z8042 Family history of malignant neoplasm of prostate: Secondary | ICD-10-CM | POA: Diagnosis not present

## 2015-03-04 DIAGNOSIS — M1990 Unspecified osteoarthritis, unspecified site: Secondary | ICD-10-CM | POA: Insufficient documentation

## 2015-03-04 DIAGNOSIS — Z808 Family history of malignant neoplasm of other organs or systems: Secondary | ICD-10-CM | POA: Diagnosis not present

## 2015-03-04 DIAGNOSIS — Z79899 Other long term (current) drug therapy: Secondary | ICD-10-CM | POA: Diagnosis not present

## 2015-03-04 DIAGNOSIS — Z87891 Personal history of nicotine dependence: Secondary | ICD-10-CM | POA: Diagnosis not present

## 2015-03-04 DIAGNOSIS — Z801 Family history of malignant neoplasm of trachea, bronchus and lung: Secondary | ICD-10-CM | POA: Diagnosis not present

## 2015-03-04 DIAGNOSIS — K648 Other hemorrhoids: Secondary | ICD-10-CM | POA: Diagnosis not present

## 2015-03-04 DIAGNOSIS — K644 Residual hemorrhoidal skin tags: Secondary | ICD-10-CM | POA: Diagnosis not present

## 2015-03-04 DIAGNOSIS — Z800 Family history of malignant neoplasm of digestive organs: Secondary | ICD-10-CM | POA: Diagnosis not present

## 2015-03-04 DIAGNOSIS — Z91013 Allergy to seafood: Secondary | ICD-10-CM | POA: Insufficient documentation

## 2015-03-04 DIAGNOSIS — Z86010 Personal history of colonic polyps: Secondary | ICD-10-CM | POA: Diagnosis not present

## 2015-03-04 HISTORY — DX: Unspecified osteoarthritis, unspecified site: M19.90

## 2015-03-04 HISTORY — PX: COLONOSCOPY: SHX5424

## 2015-03-04 SURGERY — COLONOSCOPY
Anesthesia: Moderate Sedation

## 2015-03-04 MED ORDER — MEPERIDINE HCL 50 MG/ML IJ SOLN
INTRAMUSCULAR | Status: AC
  Filled 2015-03-04: qty 1

## 2015-03-04 MED ORDER — MIDAZOLAM HCL 5 MG/5ML IJ SOLN
INTRAMUSCULAR | Status: AC
  Filled 2015-03-04: qty 10

## 2015-03-04 MED ORDER — SODIUM CHLORIDE 0.9 % IV SOLN
INTRAVENOUS | Status: DC
  Administered 2015-03-04: 1000 mL via INTRAVENOUS

## 2015-03-04 MED ORDER — MEPERIDINE HCL 50 MG/ML IJ SOLN
INTRAMUSCULAR | Status: DC | PRN
  Administered 2015-03-04 (×4): 25 mg via INTRAVENOUS

## 2015-03-04 MED ORDER — STERILE WATER FOR IRRIGATION IR SOLN
Status: DC | PRN
  Administered 2015-03-04: 09:00:00

## 2015-03-04 MED ORDER — MIDAZOLAM HCL 5 MG/5ML IJ SOLN
INTRAMUSCULAR | Status: DC | PRN
  Administered 2015-03-04 (×5): 2 mg via INTRAVENOUS

## 2015-03-04 NOTE — Discharge Instructions (Signed)
Resume usual medications and high fiber diet. No driving for 24 hours. Next screening exam in 10 years.  Colonoscopy, Care After Refer to this sheet in the next few weeks. These instructions provide you with information on caring for yourself after your procedure. Your health care provider may also give you more specific instructions. Your treatment has been planned according to current medical practices, but problems sometimes occur. Call your health care provider if you have any problems or questions after your procedure. WHAT TO EXPECT AFTER THE PROCEDURE  After your procedure, it is typical to have the following:  A small amount of blood in your stool.  Moderate amounts of gas and mild abdominal cramping or bloating. HOME CARE INSTRUCTIONS  Do not drive, operate machinery, or sign important documents for 24 hours.  You may shower and resume your regular physical activities, but move at a slower pace for the first 24 hours.  Take frequent rest periods for the first 24 hours.  Walk around or put a warm pack on your abdomen to help reduce abdominal cramping and bloating.  Drink enough fluids to keep your urine clear or pale yellow.  You may resume your normal diet as instructed by your health care provider. Avoid heavy or fried foods that are hard to digest.  Avoid drinking alcohol for 24 hours or as instructed by your health care provider.  Only take over-the-counter or prescription medicines as directed by your health care provider.  If a tissue sample (biopsy) was taken during your procedure:  Do not take aspirin or blood thinners for 7 days, or as instructed by your health care provider.  Do not drink alcohol for 7 days, or as instructed by your health care provider.  Eat soft foods for the first 24 hours. SEEK MEDICAL CARE IF: You have persistent spotting of blood in your stool 2-3 days after the procedure. SEEK IMMEDIATE MEDICAL CARE IF:  You have more than a small  spotting of blood in your stool.  You pass large blood clots in your stool.  Your abdomen is swollen (distended).  You have nausea or vomiting.  You have a fever.  You have increasing abdominal pain that is not relieved with medicine.   This information is not intended to replace advice given to you by your health care provider. Make sure you discuss any questions you have with your health care provider.   Document Released: 10/07/2003 Document Revised: 12/13/2012 Document Reviewed: 10/30/2012 Elsevier Interactive Patient Education 2016 Elsevier Inc.  High-Fiber Diet Fiber, also called dietary fiber, is a type of carbohydrate found in fruits, vegetables, whole grains, and beans. A high-fiber diet can have many health benefits. Your health care provider may recommend a high-fiber diet to help:  Prevent constipation. Fiber can make your bowel movements more regular.  Lower your cholesterol.  Relieve hemorrhoids, uncomplicated diverticulosis, or irritable bowel syndrome.  Prevent overeating as part of a weight-loss plan.  Prevent heart disease, type 2 diabetes, and certain cancers. WHAT IS MY PLAN? The recommended daily intake of fiber includes:  38 grams for men under age 29.  31 grams for men over age 46.  11 grams for women under age 81.  66 grams for women over age 29. You can get the recommended daily intake of dietary fiber by eating a variety of fruits, vegetables, grains, and beans. Your health care provider may also recommend a fiber supplement if it is not possible to get enough fiber through your diet. WHAT DO  I NEED TO KNOW ABOUT A HIGH-FIBER DIET?  Fiber supplements have not been widely studied for their effectiveness, so it is better to get fiber through food sources.  Always check the fiber content on thenutrition facts label of any prepackaged food. Look for foods that contain at least 5 grams of fiber per serving.  Ask your dietitian if you have questions  about specific foods that are related to your condition, especially if those foods are not listed in the following section.  Increase your daily fiber consumption gradually. Increasing your intake of dietary fiber too quickly may cause bloating, cramping, or gas.  Drink plenty of water. Water helps you to digest fiber. WHAT FOODS CAN I EAT? Grains Whole-grain breads. Multigrain cereal. Oats and oatmeal. Brown rice. Barley. Bulgur wheat. [place]. Bran muffins. Popcorn. Rye wafer crackers. Vegetables Sweet potatoes. Spinach. Kale. Artichokes. Cabbage. Broccoli. Green peas. Carrots. Squash. Fruits Berries. Pears. Apples. Oranges. Avocados. Prunes and raisins. Dried figs. Meats and Other Protein Sources Navy, kidney, pinto, and soy beans. Split peas. Lentils. Nuts and seeds. Dairy Fiber-fortified yogurt. Beverages Fiber-fortified soy milk. Fiber-fortified orange juice. Other Fiber bars. The items listed above may not be a complete list of recommended foods or beverages. Contact your dietitian for more options. WHAT FOODS ARE NOT RECOMMENDED? Grains White bread. Pasta made with refined flour. White rice. Vegetables Fried potatoes. Canned vegetables. Well-cooked vegetables.  Fruits Fruit juice. Cooked, strained fruit. Meats and Other Protein Sources Fatty cuts of meat. Fried Sales executive or fried fish. Dairy Milk. Yogurt. Cream cheese. Sour cream. Beverages Soft drinks. Other Cakes and pastries. Butter and oils. The items listed above may not be a complete list of foods and beverages to avoid. Contact your dietitian for more information. WHAT ARE SOME TIPS FOR INCLUDING HIGH-FIBER FOODS IN MY DIET?  Eat a wide variety of high-fiber foods.  Make sure that half of all grains consumed each day are whole grains.  Replace breads and cereals made from refined flour or white flour with whole-grain breads and cereals.  Replace white rice with brown rice, bulgur wheat, or millet.  Start the  day with a breakfast that is high in fiber, such as a cereal that contains at least 5 grams of fiber per serving.  Use beans in place of meat in soups, salads, or pasta.  Eat high-fiber snacks, such as berries, raw vegetables, nuts, or popcorn.   This information is not intended to replace advice given to you by your health care provider. Make sure you discuss any questions you have with your health care provider.   Document Released: 02/22/2005 Document Revised: 03/15/2014 Document Reviewed: 08/07/2013 Elsevier Interactive Patient Education 2016 [employer].   Diverticulosis Diverticulosis is the condition that develops when small pouches (diverticula) form in the wall of your colon. Your colon, or large intestine, is where water is absorbed and stool is formed. The pouches form when the inside layer of your colon pushes through weak spots in the outer layers of your colon. CAUSES  No one knows exactly what causes diverticulosis. RISK FACTORS  Being older than 36. Your risk for this condition increases with age. Diverticulosis is rare in people younger than 40 years. By age 41, almost everyone has it.  Eating a low-fiber diet.  Being frequently constipated.  Being overweight.  Not getting enough exercise.  Smoking.  Taking over-the-counter pain medicines, like aspirin and ibuprofen. SYMPTOMS  Most people with diverticulosis do not have symptoms. DIAGNOSIS  Because diverticulosis often has no symptoms,  health care providers often discover the condition during an exam for other colon problems. In many cases, a health care provider will diagnose diverticulosis while using a flexible scope to examine the colon (colonoscopy). TREATMENT  If you have never developed an infection related to diverticulosis, you may not need treatment. If you have had an infection before, treatment may include:  Eating more fruits, vegetables, and grains.  Taking a fiber supplement.  Taking a live  bacteria supplement (probiotic).  Taking medicine to relax your colon. HOME CARE INSTRUCTIONS   Drink at least 6-8 glasses of water each day to prevent constipation.  Try not to strain when you have a bowel movement.  Keep all follow-up appointments. If you have had an infection before:  Increase the fiber in your diet as directed by your health care provider or dietitian.  Take a dietary fiber supplement if your health care provider approves.  Only take medicines as directed by your health care provider. SEEK MEDICAL CARE IF:   You have abdominal pain.  You have bloating.  You have cramps.  You have not gone to the bathroom in 3 days. SEEK IMMEDIATE MEDICAL CARE IF:   Your pain gets worse.  Yourbloating becomes very bad.  You have a fever or chills, and your symptoms suddenly get worse.  You begin vomiting.  You have bowel movements that are bloody or black. MAKE SURE YOU:  Understand these instructions.  Will watch your condition.  Will get help right away if you are not doing well or get worse.   This information is not intended to replace advice given to you by your health care provider. Make sure you discuss any questions you have with your health care provider.   Document Released: 11/20/2003 Document Revised: 02/27/2013 Document Reviewed: 01/17/2013 Elsevier Interactive Patient Education [employer].

## 2015-03-04 NOTE — H&P (Signed)
Nancy Velez is an 64 y.o. female.   Chief Complaint: Patient is here for colonoscopy. HPI: 49 old Caucasian female who is in for screening colonoscopy. She denies abdominal pain rectal bleeding or diarrhea. Last exam was in September 2006 with removal of small polyp and was not an adenoma. Family history significant for CRC in maternal grandfather at late onset.  Past Medical History  Diagnosis Date  . Hypercholesterolemia 04/19/2013  . Arthritis     Past Surgical History  Procedure Laterality Date  . Breast surgery      lumpectomy  . Tubal ligation      Family History  Problem Relation Age of Onset  . Cancer Mother     lung  . Cancer Father     prostate  . Cancer Maternal Aunt     brain  . Heart disease Maternal Uncle   . Cancer Maternal Grandmother     pancreatic   . Heart disease Maternal Grandfather   . Heart disease Paternal Grandmother     heart attack  . Heart disease Paternal Grandfather   . Heart disease Maternal Uncle   . Heart disease Maternal Aunt   . Heart disease Maternal Aunt    Social History:  reports that she quit smoking about 17 years ago. She has never used smokeless tobacco. She reports that she drinks alcohol. She reports that she does not use illicit drugs.  Allergies:  Allergies  Allergen Reactions  . Shellfish Allergy Nausea And Vomiting    Medications Prior to Admission  Medication Sig Dispense Refill  . aspirin EC 81 MG tablet Take 81 mg by mouth 2 (two) times a week.    . Cholecalciferol (VITAMIN D-3 PO) Take 1 tablet by mouth daily.    . naproxen sodium (ALEVE) 220 MG tablet Take 220 mg by mouth daily as needed.    . polyethylene glycol-electrolytes (NULYTELY/GOLYTELY) 420 G solution Take 4,000 mLs by mouth once. 4000 mL 0  . vitamin B-12 (CYANOCOBALAMIN) 1000 MCG tablet Take 1,000 mcg by mouth daily.    Marland Kitchen VITAMIN E PO Take 1 tablet by mouth 2 (two) times a week.      No results found for this or any previous visit (from the past 48  hour(s)). No results found.  ROS  Blood pressure 125/76, pulse 86, temperature 97.9 F (36.6 C), temperature source Oral, resp. rate 18, height 5' 4.5" (1.638 m), weight 202 lb (91.627 kg), SpO2 95 %. Physical Exam  Constitutional: She appears well-developed and well-nourished.  HENT:  Mouth/Throat: Oropharynx is clear and moist.  Eyes: Conjunctivae are normal. No scleral icterus.  Neck: No thyromegaly present.  Cardiovascular: Normal rate, regular rhythm and normal heart sounds.   No murmur heard. Respiratory: Effort normal and breath sounds normal.  GI: Soft. She exhibits no distension and no mass. There is no tenderness.  Musculoskeletal: She exhibits no edema.  Lymphadenopathy:    She has no cervical adenopathy.  Neurological: She is alert.  Skin: Skin is warm.     Assessment/Plan Average risk screening colonoscopy.  Nancy Velez U 03/04/2015, 8:56 AM

## 2015-03-04 NOTE — Op Note (Signed)
COLONOSCOPY PROCEDURE REPORT  PATIENT:  Nancy Velez  MR#:  [id number] Birthdate:  07-Nov-1950, 64 y.o., female Endoscopist:  Dr. Rogene Houston, MD Referred By:  Ms. Delman Cheadle [facility]  Procedure Date: 03/04/2015  Procedure:   Colonoscopy  Indications:  Patient is 64 year old Caucasian female was undergoing average risk screening colonoscopy.  Informed Consent:  The procedure and risks were reviewed with the patient and informed consent was obtained.  Medications:  Demerol 100 mg IV Versed 10 mg IV  Description of procedure:  After a digital rectal exam was performed, that colonoscope was advanced from the anus through the rectum and colon to the area of the cecum, ileocecal valve and appendiceal orifice. The cecum was deeply intubated. These structures were well-seen and photographed for the record. From the level of the cecum and ileocecal valve, the scope was slowly and cautiously withdrawn. The mucosal surfaces were carefully surveyed utilizing scope tip to flexion to facilitate fold flattening as needed. The scope was pulled down into the rectum where a thorough exam including retroflexion was performed.  Findings:   Prep satisfactory. Noncompliant sigmoid colon resulting in formation. Scattered diverticula noted at descending and sigmoid colon. Normal rectal mucosa. Small hemorrhoids below the dentate line.   Therapeutic/Diagnostic Maneuvers Performed:   None  Complications:  None  EBL: None  Cecal Withdrawal Time:  6 minutes  Impression:  Examination performed to cecum. Scattered diverticula at descending and sigmoid colon. Small external hemorrhoids.  Recommendations:  Standard instructions given. High fiber diet. Next screening exam in 10 years.  Jennet Scroggin U  03/04/2015 10:09 AM  CC: Dr. Delman Cheadle, PA-C & Dr. Rayne Du ref. provider found

## 2015-03-06 ENCOUNTER — Encounter (HOSPITAL_COMMUNITY): Payer: Self-pay | Admitting: Internal Medicine

## 2015-03-17 ENCOUNTER — Ambulatory Visit
Admission: RE | Admit: 2015-03-17 | Discharge: 2015-03-17 | Disposition: A | Discharge status: Home / Self Care | Payer: Medicare Other | Source: Ambulatory Visit | Attending: Obstetrics and Gynecology | Admitting: Obstetrics and Gynecology

## 2015-03-17 DIAGNOSIS — R921 Mammographic calcification found on diagnostic imaging of breast: Secondary | ICD-10-CM | POA: Diagnosis not present

## 2015-04-28 DIAGNOSIS — J029 Acute pharyngitis, unspecified: Secondary | ICD-10-CM | POA: Diagnosis not present

## 2015-04-28 DIAGNOSIS — J019 Acute sinusitis, unspecified: Secondary | ICD-10-CM | POA: Diagnosis not present

## 2015-04-28 DIAGNOSIS — Z6834 Body mass index (BMI) 34.0-34.9, adult: Secondary | ICD-10-CM | POA: Diagnosis not present

## 2015-04-28 DIAGNOSIS — Z1389 Encounter for screening for other disorder: Secondary | ICD-10-CM | POA: Diagnosis not present

## 2015-05-07 ENCOUNTER — Ambulatory Visit (INDEPENDENT_AMBULATORY_CARE_PROVIDER_SITE_OTHER): Payer: Medicare Other | Admitting: Adult Health

## 2015-05-07 ENCOUNTER — Encounter: Payer: Self-pay | Admitting: Adult Health

## 2015-05-07 ENCOUNTER — Other Ambulatory Visit (HOSPITAL_COMMUNITY)
Admission: RE | Admit: 2015-05-07 | Discharge: 2015-05-07 | Disposition: A | Discharge status: Home / Self Care | Payer: Medicare Other | Source: Ambulatory Visit | Attending: Adult Health | Admitting: Adult Health

## 2015-05-07 VITALS — BP 140/90 | HR 84 | Ht 62.0 in | Wt 199.0 lb

## 2015-05-07 DIAGNOSIS — Z1211 Encounter for screening for malignant neoplasm of colon: Secondary | ICD-10-CM | POA: Diagnosis not present

## 2015-05-07 DIAGNOSIS — Z01419 Encounter for gynecological examination (general) (routine) without abnormal findings: Secondary | ICD-10-CM | POA: Insufficient documentation

## 2015-05-07 DIAGNOSIS — Z124 Encounter for screening for malignant neoplasm of cervix: Secondary | ICD-10-CM | POA: Diagnosis not present

## 2015-05-07 DIAGNOSIS — Z1151 Encounter for screening for human papillomavirus (HPV): Secondary | ICD-10-CM | POA: Insufficient documentation

## 2015-05-07 LAB — HEMOCCULT GUIAC POC 1CARD (OFFICE): Fecal Occult Blood, POC: NEGATIVE

## 2015-05-07 NOTE — Patient Instructions (Signed)
Physical in 2 years Mammogram yearly Colonoscopy per Dr Laural Golden

## 2015-05-07 NOTE — Addendum Note (Signed)
Addended by: Linton Rump on: 05/07/2015 04:54 PM   Modules accepted: Orders

## 2015-05-07 NOTE — Progress Notes (Signed)
Patient ID: Nancy Velez, female   DOB: 1950/09/15, 65 y.o.   MRN: [id number] History of Present Illness: Nancy Velez is a 65 year old white female, married in for a well woman gyn exam and pap.She had her colonoscopy in Fall with Dr Laural Golden. PCP is Nancy Velez.  Current Medications, Allergies, Past Medical History, Past Surgical History, Family History and Social History were reviewed in [employer] record.     Review of Systems: Patient denies any headaches, hearing loss, fatigue, blurred vision, shortness of breath, chest pain, abdominal pain, problems with bowel movements, urination, or intercourse. No joint pain or mood swings.    Physical Exam:BP 140/90 mmHg  Pulse 84  Ht 5\' 2"  (1.575 m)  Wt 199 lb (90.266 kg)  BMI 36.39 kg/m2 General:  Well developed, well nourished, no acute distress Skin:  Warm and dry Neck:  Midline trachea, normal thyroid, good ROM, no lymphadenopathy, no carotid bruits heard Lungs; Clear to auscultation bilaterally Breast:  No dominant palpable mass, retraction, or nipple discharge Cardiovascular: Regular rate and rhythm Abdomen:  Soft, non tender, no hepatosplenomegaly Pelvic:  External genitalia is normal in appearance, no lesions.  The vagina is normal in appearance. Urethra has no lesions or masses. The cervix is smooth and  Bulbous.Pap with HPV performed.   Uterus is felt to be normal size, shape, and contour.  No adnexal masses or tenderness noted.Bladder is non tender, no masses felt. Rectal: Good sphincter tone, no polyps, or hemorrhoids felt.  Hemoccult negative. Extremities/musculoskeletal:  No swelling or varicosities noted, no clubbing or cyanosis Psych:  No mood changes, alert and cooperative,seems happy   Impression: Well woman gyn exam and pap    Plan: Physical in 2 years Mammogram yearly Colonoscopy per GI Labs with PCP

## 2015-05-09 LAB — CYTOLOGY - PAP

## 2015-07-16 DIAGNOSIS — J343 Hypertrophy of nasal turbinates: Secondary | ICD-10-CM | POA: Diagnosis not present

## 2015-07-16 DIAGNOSIS — Z1389 Encounter for screening for other disorder: Secondary | ICD-10-CM | POA: Diagnosis not present

## 2015-07-16 DIAGNOSIS — J069 Acute upper respiratory infection, unspecified: Secondary | ICD-10-CM | POA: Diagnosis not present

## 2015-07-16 DIAGNOSIS — R070 Pain in throat: Secondary | ICD-10-CM | POA: Diagnosis not present

## 2015-07-16 DIAGNOSIS — J302 Other seasonal allergic rhinitis: Secondary | ICD-10-CM | POA: Diagnosis not present

## 2015-07-16 DIAGNOSIS — Z6835 Body mass index (BMI) 35.0-35.9, adult: Secondary | ICD-10-CM | POA: Diagnosis not present

## 2015-07-28 DIAGNOSIS — E538 Deficiency of other specified B group vitamins: Secondary | ICD-10-CM | POA: Diagnosis not present

## 2015-08-13 DIAGNOSIS — Z0000 Encounter for general adult medical examination without abnormal findings: Secondary | ICD-10-CM | POA: Diagnosis not present

## 2015-08-13 DIAGNOSIS — E782 Mixed hyperlipidemia: Secondary | ICD-10-CM | POA: Diagnosis not present

## 2015-08-13 DIAGNOSIS — Z6834 Body mass index (BMI) 34.0-34.9, adult: Secondary | ICD-10-CM | POA: Diagnosis not present

## 2015-08-13 DIAGNOSIS — Z79899 Other long term (current) drug therapy: Secondary | ICD-10-CM | POA: Diagnosis not present

## 2015-08-13 DIAGNOSIS — Z1389 Encounter for screening for other disorder: Secondary | ICD-10-CM | POA: Diagnosis not present

## 2015-08-20 DIAGNOSIS — N10 Acute tubulo-interstitial nephritis: Secondary | ICD-10-CM | POA: Diagnosis not present

## 2015-08-20 DIAGNOSIS — Z6834 Body mass index (BMI) 34.0-34.9, adult: Secondary | ICD-10-CM | POA: Diagnosis not present

## 2015-08-20 DIAGNOSIS — N342 Other urethritis: Secondary | ICD-10-CM | POA: Diagnosis not present

## 2015-08-27 DIAGNOSIS — D225 Melanocytic nevi of trunk: Secondary | ICD-10-CM | POA: Diagnosis not present

## 2015-08-27 DIAGNOSIS — D485 Neoplasm of uncertain behavior of skin: Secondary | ICD-10-CM | POA: Diagnosis not present

## 2015-08-27 DIAGNOSIS — Z1283 Encounter for screening for malignant neoplasm of skin: Secondary | ICD-10-CM | POA: Diagnosis not present

## 2015-09-17 DIAGNOSIS — E538 Deficiency of other specified B group vitamins: Secondary | ICD-10-CM | POA: Diagnosis not present

## 2015-10-09 ENCOUNTER — Other Ambulatory Visit: Payer: Self-pay | Admitting: Obstetrics and Gynecology

## 2015-10-09 DIAGNOSIS — R921 Mammographic calcification found on diagnostic imaging of breast: Secondary | ICD-10-CM

## 2015-10-16 ENCOUNTER — Other Ambulatory Visit: Payer: Medicare Other

## 2015-10-17 ENCOUNTER — Other Ambulatory Visit: Payer: Medicare Other

## 2015-10-22 DIAGNOSIS — E538 Deficiency of other specified B group vitamins: Secondary | ICD-10-CM | POA: Diagnosis not present

## 2015-10-23 ENCOUNTER — Ambulatory Visit
Admission: RE | Admit: 2015-10-23 | Discharge: 2015-10-23 | Disposition: A | Discharge status: Home / Self Care | Payer: Medicare Other | Source: Ambulatory Visit | Attending: Obstetrics and Gynecology | Admitting: Obstetrics and Gynecology

## 2015-10-23 DIAGNOSIS — R921 Mammographic calcification found on diagnostic imaging of breast: Secondary | ICD-10-CM

## 2015-11-03 DIAGNOSIS — H5213 Myopia, bilateral: Secondary | ICD-10-CM | POA: Diagnosis not present

## 2015-11-03 DIAGNOSIS — H524 Presbyopia: Secondary | ICD-10-CM | POA: Diagnosis not present

## 2015-11-03 DIAGNOSIS — H2513 Age-related nuclear cataract, bilateral: Secondary | ICD-10-CM | POA: Diagnosis not present

## 2015-11-03 DIAGNOSIS — H25013 Cortical age-related cataract, bilateral: Secondary | ICD-10-CM | POA: Diagnosis not present

## 2015-11-03 DIAGNOSIS — H52221 Regular astigmatism, right eye: Secondary | ICD-10-CM | POA: Diagnosis not present

## 2015-11-26 DIAGNOSIS — E538 Deficiency of other specified B group vitamins: Secondary | ICD-10-CM | POA: Diagnosis not present

## 2015-12-18 DIAGNOSIS — Z23 Encounter for immunization: Secondary | ICD-10-CM | POA: Diagnosis not present

## 2015-12-31 DIAGNOSIS — E538 Deficiency of other specified B group vitamins: Secondary | ICD-10-CM | POA: Diagnosis not present

## 2016-02-04 DIAGNOSIS — D510 Vitamin B12 deficiency anemia due to intrinsic factor deficiency: Secondary | ICD-10-CM | POA: Diagnosis not present

## 2016-02-06 DIAGNOSIS — L258 Unspecified contact dermatitis due to other agents: Secondary | ICD-10-CM | POA: Diagnosis not present

## 2016-03-02 DIAGNOSIS — E538 Deficiency of other specified B group vitamins: Secondary | ICD-10-CM | POA: Diagnosis not present

## 2016-03-02 DIAGNOSIS — R7989 Other specified abnormal findings of blood chemistry: Secondary | ICD-10-CM | POA: Diagnosis not present

## 2016-04-02 DIAGNOSIS — S8261XA Displaced fracture of lateral malleolus of right fibula, initial encounter for closed fracture: Secondary | ICD-10-CM | POA: Diagnosis not present

## 2016-04-02 DIAGNOSIS — S8254XA Nondisplaced fracture of medial malleolus of right tibia, initial encounter for closed fracture: Secondary | ICD-10-CM | POA: Diagnosis not present

## 2016-04-02 DIAGNOSIS — S99911A Unspecified injury of right ankle, initial encounter: Secondary | ICD-10-CM | POA: Diagnosis not present

## 2016-04-02 DIAGNOSIS — M19071 Primary osteoarthritis, right ankle and foot: Secondary | ICD-10-CM | POA: Diagnosis not present

## 2016-04-02 DIAGNOSIS — S82851A Displaced trimalleolar fracture of right lower leg, initial encounter for closed fracture: Secondary | ICD-10-CM | POA: Diagnosis not present

## 2016-04-02 DIAGNOSIS — E669 Obesity, unspecified: Secondary | ICD-10-CM | POA: Diagnosis not present

## 2016-04-02 DIAGNOSIS — S82831A Other fracture of upper and lower end of right fibula, initial encounter for closed fracture: Secondary | ICD-10-CM | POA: Diagnosis not present

## 2016-04-02 DIAGNOSIS — S82841A Displaced bimalleolar fracture of right lower leg, initial encounter for closed fracture: Secondary | ICD-10-CM | POA: Diagnosis not present

## 2016-04-02 DIAGNOSIS — Z87891 Personal history of nicotine dependence: Secondary | ICD-10-CM | POA: Diagnosis not present

## 2016-04-02 DIAGNOSIS — M7751 Other enthesopathy of right foot: Secondary | ICD-10-CM | POA: Diagnosis not present

## 2016-04-02 DIAGNOSIS — Z91013 Allergy to seafood: Secondary | ICD-10-CM | POA: Diagnosis not present

## 2016-04-02 DIAGNOSIS — Z9851 Tubal ligation status: Secondary | ICD-10-CM | POA: Diagnosis not present

## 2016-04-05 DIAGNOSIS — S82841A Displaced bimalleolar fracture of right lower leg, initial encounter for closed fracture: Secondary | ICD-10-CM | POA: Diagnosis not present

## 2016-04-13 DIAGNOSIS — S82841A Displaced bimalleolar fracture of right lower leg, initial encounter for closed fracture: Secondary | ICD-10-CM | POA: Diagnosis not present

## 2016-04-13 DIAGNOSIS — G8918 Other acute postprocedural pain: Secondary | ICD-10-CM | POA: Diagnosis not present

## 2016-04-13 DIAGNOSIS — Y939 Activity, unspecified: Secondary | ICD-10-CM | POA: Diagnosis not present

## 2016-04-13 DIAGNOSIS — Y929 Unspecified place or not applicable: Secondary | ICD-10-CM | POA: Diagnosis not present

## 2016-04-13 DIAGNOSIS — S82841D Displaced bimalleolar fracture of right lower leg, subsequent encounter for closed fracture with routine healing: Secondary | ICD-10-CM | POA: Diagnosis not present

## 2016-04-13 DIAGNOSIS — Y999 Unspecified external cause status: Secondary | ICD-10-CM | POA: Diagnosis not present

## 2016-04-13 DIAGNOSIS — X58XXXA Exposure to other specified factors, initial encounter: Secondary | ICD-10-CM | POA: Diagnosis not present

## 2016-04-22 DIAGNOSIS — Z9181 History of falling: Secondary | ICD-10-CM | POA: Diagnosis not present

## 2016-04-22 DIAGNOSIS — M1990 Unspecified osteoarthritis, unspecified site: Secondary | ICD-10-CM | POA: Diagnosis not present

## 2016-04-22 DIAGNOSIS — W19XXXD Unspecified fall, subsequent encounter: Secondary | ICD-10-CM | POA: Diagnosis not present

## 2016-04-22 DIAGNOSIS — Z87891 Personal history of nicotine dependence: Secondary | ICD-10-CM | POA: Diagnosis not present

## 2016-04-22 DIAGNOSIS — S82841D Displaced bimalleolar fracture of right lower leg, subsequent encounter for closed fracture with routine healing: Secondary | ICD-10-CM | POA: Diagnosis not present

## 2016-04-28 DIAGNOSIS — S82841D Displaced bimalleolar fracture of right lower leg, subsequent encounter for closed fracture with routine healing: Secondary | ICD-10-CM | POA: Diagnosis not present

## 2016-05-26 DIAGNOSIS — S82841D Displaced bimalleolar fracture of right lower leg, subsequent encounter for closed fracture with routine healing: Secondary | ICD-10-CM | POA: Diagnosis not present

## 2016-06-02 DIAGNOSIS — E538 Deficiency of other specified B group vitamins: Secondary | ICD-10-CM | POA: Diagnosis not present

## 2016-06-10 DIAGNOSIS — S82841D Displaced bimalleolar fracture of right lower leg, subsequent encounter for closed fracture with routine healing: Secondary | ICD-10-CM | POA: Diagnosis not present

## 2016-06-28 DIAGNOSIS — S82841D Displaced bimalleolar fracture of right lower leg, subsequent encounter for closed fracture with routine healing: Secondary | ICD-10-CM | POA: Diagnosis not present

## 2016-07-07 DIAGNOSIS — E538 Deficiency of other specified B group vitamins: Secondary | ICD-10-CM | POA: Diagnosis not present

## 2016-08-31 DIAGNOSIS — J343 Hypertrophy of nasal turbinates: Secondary | ICD-10-CM | POA: Diagnosis not present

## 2016-08-31 DIAGNOSIS — J019 Acute sinusitis, unspecified: Secondary | ICD-10-CM | POA: Diagnosis not present

## 2016-08-31 DIAGNOSIS — R070 Pain in throat: Secondary | ICD-10-CM | POA: Diagnosis not present

## 2016-08-31 DIAGNOSIS — J040 Acute laryngitis: Secondary | ICD-10-CM | POA: Diagnosis not present

## 2016-08-31 DIAGNOSIS — J069 Acute upper respiratory infection, unspecified: Secondary | ICD-10-CM | POA: Diagnosis not present

## 2016-08-31 DIAGNOSIS — Z6834 Body mass index (BMI) 34.0-34.9, adult: Secondary | ICD-10-CM | POA: Diagnosis not present

## 2016-09-23 DIAGNOSIS — Z131 Encounter for screening for diabetes mellitus: Secondary | ICD-10-CM | POA: Diagnosis not present

## 2016-09-23 DIAGNOSIS — Z1389 Encounter for screening for other disorder: Secondary | ICD-10-CM | POA: Diagnosis not present

## 2016-09-23 DIAGNOSIS — E669 Obesity, unspecified: Secondary | ICD-10-CM | POA: Diagnosis not present

## 2016-09-23 DIAGNOSIS — Z6834 Body mass index (BMI) 34.0-34.9, adult: Secondary | ICD-10-CM | POA: Diagnosis not present

## 2016-09-23 DIAGNOSIS — R7309 Other abnormal glucose: Secondary | ICD-10-CM | POA: Diagnosis not present

## 2016-09-23 DIAGNOSIS — Z0000 Encounter for general adult medical examination without abnormal findings: Secondary | ICD-10-CM | POA: Diagnosis not present

## 2016-09-23 DIAGNOSIS — E6609 Other obesity due to excess calories: Secondary | ICD-10-CM | POA: Diagnosis not present

## 2016-09-27 ENCOUNTER — Other Ambulatory Visit: Payer: Self-pay | Admitting: Registered Nurse

## 2016-09-27 ENCOUNTER — Other Ambulatory Visit (HOSPITAL_COMMUNITY): Payer: Self-pay | Admitting: Registered Nurse

## 2016-09-27 DIAGNOSIS — R921 Mammographic calcification found on diagnostic imaging of breast: Secondary | ICD-10-CM

## 2016-09-27 DIAGNOSIS — R5381 Other malaise: Secondary | ICD-10-CM

## 2016-09-28 ENCOUNTER — Other Ambulatory Visit: Payer: Self-pay | Admitting: Registered Nurse

## 2016-09-28 DIAGNOSIS — R921 Mammographic calcification found on diagnostic imaging of breast: Secondary | ICD-10-CM

## 2016-10-25 ENCOUNTER — Ambulatory Visit
Admission: RE | Admit: 2016-10-25 | Discharge: 2016-10-25 | Disposition: A | Discharge status: Home / Self Care | Payer: Medicare Other | Source: Ambulatory Visit | Attending: Registered Nurse | Admitting: Registered Nurse

## 2016-10-25 DIAGNOSIS — R921 Mammographic calcification found on diagnostic imaging of breast: Secondary | ICD-10-CM | POA: Diagnosis not present

## 2016-10-27 DIAGNOSIS — D510 Vitamin B12 deficiency anemia due to intrinsic factor deficiency: Secondary | ICD-10-CM | POA: Diagnosis not present

## 2016-12-08 DIAGNOSIS — Z23 Encounter for immunization: Secondary | ICD-10-CM | POA: Diagnosis not present

## 2016-12-08 DIAGNOSIS — D510 Vitamin B12 deficiency anemia due to intrinsic factor deficiency: Secondary | ICD-10-CM | POA: Diagnosis not present

## 2016-12-29 DIAGNOSIS — Z23 Encounter for immunization: Secondary | ICD-10-CM | POA: Diagnosis not present

## 2017-01-12 DIAGNOSIS — D510 Vitamin B12 deficiency anemia due to intrinsic factor deficiency: Secondary | ICD-10-CM | POA: Diagnosis not present

## 2017-02-23 DIAGNOSIS — E538 Deficiency of other specified B group vitamins: Secondary | ICD-10-CM | POA: Diagnosis not present

## 2017-03-17 DIAGNOSIS — D225 Melanocytic nevi of trunk: Secondary | ICD-10-CM | POA: Diagnosis not present

## 2017-03-17 DIAGNOSIS — L821 Other seborrheic keratosis: Secondary | ICD-10-CM | POA: Diagnosis not present

## 2017-03-17 DIAGNOSIS — L308 Other specified dermatitis: Secondary | ICD-10-CM | POA: Diagnosis not present

## 2017-04-06 DIAGNOSIS — E538 Deficiency of other specified B group vitamins: Secondary | ICD-10-CM | POA: Diagnosis not present

## 2017-04-26 DIAGNOSIS — E782 Mixed hyperlipidemia: Secondary | ICD-10-CM | POA: Diagnosis not present

## 2017-05-18 DIAGNOSIS — D510 Vitamin B12 deficiency anemia due to intrinsic factor deficiency: Secondary | ICD-10-CM | POA: Diagnosis not present

## 2017-05-30 DIAGNOSIS — H2513 Age-related nuclear cataract, bilateral: Secondary | ICD-10-CM | POA: Diagnosis not present

## 2017-05-30 DIAGNOSIS — H25013 Cortical age-related cataract, bilateral: Secondary | ICD-10-CM | POA: Diagnosis not present

## 2017-06-09 DIAGNOSIS — R635 Abnormal weight gain: Secondary | ICD-10-CM | POA: Diagnosis not present

## 2017-06-22 DIAGNOSIS — E538 Deficiency of other specified B group vitamins: Secondary | ICD-10-CM | POA: Diagnosis not present

## 2017-07-06 ENCOUNTER — Ambulatory Visit (INDEPENDENT_AMBULATORY_CARE_PROVIDER_SITE_OTHER): Payer: Medicare Other | Admitting: Adult Health

## 2017-07-06 ENCOUNTER — Encounter (INDEPENDENT_AMBULATORY_CARE_PROVIDER_SITE_OTHER): Payer: Self-pay

## 2017-07-06 ENCOUNTER — Encounter: Payer: Self-pay | Admitting: Adult Health

## 2017-07-06 ENCOUNTER — Other Ambulatory Visit (HOSPITAL_COMMUNITY)
Admission: RE | Admit: 2017-07-06 | Discharge: 2017-07-06 | Disposition: A | Discharge status: Home / Self Care | Payer: Medicare Other | Source: Ambulatory Visit | Attending: Adult Health | Admitting: Adult Health

## 2017-07-06 VITALS — BP 122/60 | HR 84 | Ht 62.0 in | Wt 203.5 lb

## 2017-07-06 DIAGNOSIS — Z1159 Encounter for screening for other viral diseases: Secondary | ICD-10-CM | POA: Diagnosis not present

## 2017-07-06 DIAGNOSIS — Z1321 Encounter for screening for nutritional disorder: Secondary | ICD-10-CM

## 2017-07-06 DIAGNOSIS — Z124 Encounter for screening for malignant neoplasm of cervix: Secondary | ICD-10-CM | POA: Diagnosis not present

## 2017-07-06 DIAGNOSIS — Z1212 Encounter for screening for malignant neoplasm of rectum: Secondary | ICD-10-CM | POA: Diagnosis not present

## 2017-07-06 DIAGNOSIS — Z0183 Encounter for blood typing: Secondary | ICD-10-CM

## 2017-07-06 DIAGNOSIS — R350 Frequency of micturition: Secondary | ICD-10-CM | POA: Diagnosis not present

## 2017-07-06 DIAGNOSIS — Z13220 Encounter for screening for lipoid disorders: Secondary | ICD-10-CM | POA: Diagnosis not present

## 2017-07-06 DIAGNOSIS — Z01419 Encounter for gynecological examination (general) (routine) without abnormal findings: Secondary | ICD-10-CM

## 2017-07-06 DIAGNOSIS — Z1211 Encounter for screening for malignant neoplasm of colon: Secondary | ICD-10-CM

## 2017-07-06 LAB — POCT URINALYSIS DIPSTICK
Blood, UA: NEGATIVE
Glucose, UA: NEGATIVE
Leukocytes, UA: NEGATIVE
NITRITE UA: NEGATIVE
PROTEIN UA: NEGATIVE

## 2017-07-06 LAB — HEMOCCULT GUIAC POC 1CARD (OFFICE): Fecal Occult Blood, POC: NEGATIVE

## 2017-07-06 NOTE — Progress Notes (Signed)
Patient ID: Nancy Velez, female   DOB: June 09, 1950, 67 y.o.   MRN: [id number] History of Present Illness: Nancy Velez is a 67 year old white female, married, PM in for a well woman gyn exam and pap. PCP is [place].   Current Medications, Allergies, Past Medical History, Past Surgical History, Family History and Social History were reviewed in [employer] record.     Review of Systems: Patient denies any headaches, hearing loss, fatigue, blurred vision, shortness of breath, chest pain, abdominal pain, problems with bowel movements, urination, or intercourse. No joint pain or mood swings.    Physical Exam: BP 122/60 (BP Location: Left Arm, Patient Position: Sitting, Cuff Size: Normal)   Pulse 84   Ht 5\' 2"  (1.575 m)   Wt 203 lb 8 oz (92.3 kg)   BMI 37.22 kg/m  urine small ketones.  General:  Well developed, well nourished, no acute distress Skin:  Warm and dry Neck:  Midline trachea, normal thyroid, good ROM, no lymphadenopathy,no carotid bruits heard Lungs; Clear to auscultation bilaterally Breast:  No dominant palpable mass, retraction, or nipple discharge Cardiovascular: Regular rate and rhythm Abdomen:  Soft, non tender, no hepatosplenomegaly Pelvic:  External genitalia is normal in appearance, no lesions.  The vagina is normal in appearance. Urethra has no lesions or masses. The cervix is smooth, pap with HPV performed.  Uterus is felt to be normal size, shape, and contour.  No adnexal masses or tenderness noted.Bladder is non tender, no masses felt. Rectal: Good sphincter tone, no polyps, or hemorrhoids felt.  Hemoccult negative. Extremities/musculoskeletal:  No swelling or varicosities noted, no clubbing or cyanosis Psych:  No mood changes, alert and cooperative,seems happy PHQ 2 score 0.  Impression: 1. Encounter for gynecological examination with Papanicolaou smear of cervix   2. Frequent urination   3. Screening for colorectal cancer   4. Screening  cholesterol level   5. Need for hepatitis C screening test   6. Encounter for vitamin deficiency screening   7. Encounter for blood typing       Plan: Check CBC,CMP,lipids, hept C antibody,  and ABORH  Mammogram yearly Physical in 2 years

## 2017-07-07 ENCOUNTER — Telehealth: Payer: Self-pay | Admitting: Adult Health

## 2017-07-07 LAB — CBC
HEMATOCRIT: 41.9 % (ref 34.0–46.6)
Hemoglobin: 14 g/dL (ref 11.1–15.9)
MCH: 29.7 pg (ref 26.6–33.0)
MCHC: 33.4 g/dL (ref 31.5–35.7)
MCV: 89 fL (ref 79–97)
PLATELETS: 298 10*3/uL (ref 150–379)
RBC: 4.71 x10E6/uL (ref 3.77–5.28)
RDW: 14.8 % (ref 12.3–15.4)
WBC: 6.7 10*3/uL (ref 3.4–10.8)

## 2017-07-07 LAB — ABO/RH: Rh Factor: POSITIVE

## 2017-07-07 LAB — COMPREHENSIVE METABOLIC PANEL
ALK PHOS: 86 IU/L (ref 39–117)
ALT: 25 IU/L (ref 0–32)
AST: 28 IU/L (ref 0–40)
Albumin/Globulin Ratio: 1.6 (ref 1.2–2.2)
Albumin: 4.6 g/dL (ref 3.6–4.8)
BUN/Creatinine Ratio: 25 (ref 12–28)
BUN: 15 mg/dL (ref 8–27)
Bilirubin Total: 0.3 mg/dL (ref 0.0–1.2)
CO2: 25 mmol/L (ref 20–29)
Calcium: 9.5 mg/dL (ref 8.7–10.3)
Chloride: 102 mmol/L (ref 96–106)
Creatinine, Ser: 0.6 mg/dL (ref 0.57–1.00)
GFR calc Af Amer: 108 mL/min/{1.73_m2} (ref >59)
GFR calc non Af Amer: 94 mL/min/{1.73_m2} (ref >59)
Globulin, Total: 2.9 g/dL (ref 1.5–4.5)
Glucose: 93 mg/dL (ref 65–99)
POTASSIUM: 4.5 mmol/L (ref 3.5–5.2)
Sodium: 143 mmol/L (ref 134–144)
Total Protein: 7.5 g/dL (ref 6.0–8.5)

## 2017-07-07 LAB — HEPATITIS C ANTIBODY

## 2017-07-07 LAB — LIPID PANEL
CHOLESTEROL TOTAL: 222 mg/dL — AB (ref 100–199)
Chol/HDL Ratio: 3 ratio (ref 0.0–4.4)
HDL: 74 mg/dL (ref >39)
LDL Calculated: 130 mg/dL — ABNORMAL HIGH (ref 0–99)
TRIGLYCERIDES: 90 mg/dL (ref 0–149)
VLDL Cholesterol Cal: 18 mg/dL (ref 5–40)

## 2017-07-07 NOTE — Telephone Encounter (Signed)
Pt aware that labs look good, keep doing what you are doing

## 2017-07-08 LAB — CYTOLOGY - PAP
Diagnosis: NEGATIVE
HPV: NOT DETECTED

## 2017-08-03 DIAGNOSIS — E538 Deficiency of other specified B group vitamins: Secondary | ICD-10-CM | POA: Diagnosis not present

## 2017-09-07 DIAGNOSIS — E538 Deficiency of other specified B group vitamins: Secondary | ICD-10-CM | POA: Diagnosis not present

## 2017-10-04 DIAGNOSIS — J22 Unspecified acute lower respiratory infection: Secondary | ICD-10-CM | POA: Diagnosis not present

## 2017-10-04 DIAGNOSIS — E6609 Other obesity due to excess calories: Secondary | ICD-10-CM | POA: Diagnosis not present

## 2017-10-04 DIAGNOSIS — Z6835 Body mass index (BMI) 35.0-35.9, adult: Secondary | ICD-10-CM | POA: Diagnosis not present

## 2017-10-04 DIAGNOSIS — Z1389 Encounter for screening for other disorder: Secondary | ICD-10-CM | POA: Diagnosis not present

## 2017-10-05 ENCOUNTER — Other Ambulatory Visit: Payer: Self-pay

## 2017-10-12 DIAGNOSIS — E538 Deficiency of other specified B group vitamins: Secondary | ICD-10-CM | POA: Diagnosis not present

## 2017-11-02 DIAGNOSIS — E6609 Other obesity due to excess calories: Secondary | ICD-10-CM | POA: Diagnosis not present

## 2017-11-02 DIAGNOSIS — Z6834 Body mass index (BMI) 34.0-34.9, adult: Secondary | ICD-10-CM | POA: Diagnosis not present

## 2017-11-02 DIAGNOSIS — Z0000 Encounter for general adult medical examination without abnormal findings: Secondary | ICD-10-CM | POA: Diagnosis not present

## 2017-11-02 DIAGNOSIS — Z1389 Encounter for screening for other disorder: Secondary | ICD-10-CM | POA: Diagnosis not present

## 2017-11-16 DIAGNOSIS — E538 Deficiency of other specified B group vitamins: Secondary | ICD-10-CM | POA: Diagnosis not present

## 2017-11-16 DIAGNOSIS — Z23 Encounter for immunization: Secondary | ICD-10-CM | POA: Diagnosis not present

## 2017-12-07 DIAGNOSIS — Z23 Encounter for immunization: Secondary | ICD-10-CM | POA: Diagnosis not present

## 2017-12-07 DIAGNOSIS — E538 Deficiency of other specified B group vitamins: Secondary | ICD-10-CM | POA: Diagnosis not present

## 2018-01-19 DIAGNOSIS — J019 Acute sinusitis, unspecified: Secondary | ICD-10-CM | POA: Diagnosis not present

## 2018-01-19 DIAGNOSIS — E538 Deficiency of other specified B group vitamins: Secondary | ICD-10-CM | POA: Diagnosis not present

## 2018-01-19 DIAGNOSIS — H6983 Other specified disorders of Eustachian tube, bilateral: Secondary | ICD-10-CM | POA: Diagnosis not present

## 2018-01-19 DIAGNOSIS — E6609 Other obesity due to excess calories: Secondary | ICD-10-CM | POA: Diagnosis not present

## 2018-01-19 DIAGNOSIS — Z6833 Body mass index (BMI) 33.0-33.9, adult: Secondary | ICD-10-CM | POA: Diagnosis not present

## 2018-02-22 DIAGNOSIS — E538 Deficiency of other specified B group vitamins: Secondary | ICD-10-CM | POA: Diagnosis not present

## 2018-03-29 DIAGNOSIS — D510 Vitamin B12 deficiency anemia due to intrinsic factor deficiency: Secondary | ICD-10-CM | POA: Diagnosis not present

## 2018-05-03 DIAGNOSIS — E538 Deficiency of other specified B group vitamins: Secondary | ICD-10-CM | POA: Diagnosis not present

## 2018-05-15 DIAGNOSIS — H25013 Cortical age-related cataract, bilateral: Secondary | ICD-10-CM | POA: Diagnosis not present

## 2018-05-15 DIAGNOSIS — H04122 Dry eye syndrome of left lacrimal gland: Secondary | ICD-10-CM | POA: Diagnosis not present

## 2018-05-15 DIAGNOSIS — D225 Melanocytic nevi of trunk: Secondary | ICD-10-CM | POA: Diagnosis not present

## 2018-05-15 DIAGNOSIS — L218 Other seborrheic dermatitis: Secondary | ICD-10-CM | POA: Diagnosis not present

## 2018-05-15 DIAGNOSIS — L308 Other specified dermatitis: Secondary | ICD-10-CM | POA: Diagnosis not present

## 2018-05-15 DIAGNOSIS — H2511 Age-related nuclear cataract, right eye: Secondary | ICD-10-CM | POA: Diagnosis not present

## 2018-05-15 DIAGNOSIS — H04121 Dry eye syndrome of right lacrimal gland: Secondary | ICD-10-CM | POA: Diagnosis not present

## 2018-05-15 DIAGNOSIS — H2513 Age-related nuclear cataract, bilateral: Secondary | ICD-10-CM | POA: Diagnosis not present

## 2018-05-15 DIAGNOSIS — H25043 Posterior subcapsular polar age-related cataract, bilateral: Secondary | ICD-10-CM | POA: Diagnosis not present

## 2018-05-15 DIAGNOSIS — H16221 Keratoconjunctivitis sicca, not specified as Sjogren's, right eye: Secondary | ICD-10-CM | POA: Diagnosis not present

## 2018-05-15 DIAGNOSIS — H16222 Keratoconjunctivitis sicca, not specified as Sjogren's, left eye: Secondary | ICD-10-CM | POA: Diagnosis not present

## 2018-06-15 DIAGNOSIS — E538 Deficiency of other specified B group vitamins: Secondary | ICD-10-CM | POA: Diagnosis not present

## 2018-06-15 DIAGNOSIS — E1151 Type 2 diabetes mellitus with diabetic peripheral angiopathy without gangrene: Secondary | ICD-10-CM | POA: Diagnosis not present

## 2018-06-15 DIAGNOSIS — S52301D Unspecified fracture of shaft of right radius, subsequent encounter for closed fracture with routine healing: Secondary | ICD-10-CM | POA: Diagnosis not present

## 2018-06-15 DIAGNOSIS — R262 Difficulty in walking, not elsewhere classified: Secondary | ICD-10-CM | POA: Diagnosis not present

## 2018-06-15 DIAGNOSIS — D649 Anemia, unspecified: Secondary | ICD-10-CM | POA: Diagnosis not present

## 2018-07-01 IMAGING — MG 2D DIGITAL DIAGNOSTIC BILATERAL MAMMOGRAM WITH CAD AND ADJUNCT T
8 of 14 series · 8 of 30 positions shown · non-contrast
Comparison: 03/17/2015, 08/23/2014, 08/16/2014, 04/20/2013.

CLINICAL DATA: Re-evaluation of probably benign right breast
calcifications.This is the 1 year follow-up evaluation.

EXAM:
2D DIGITAL DIAGNOSTIC BILATERAL MAMMOGRAM WITH CAD AND ADJUNCT TOMO

[R CC (1 of 2)]
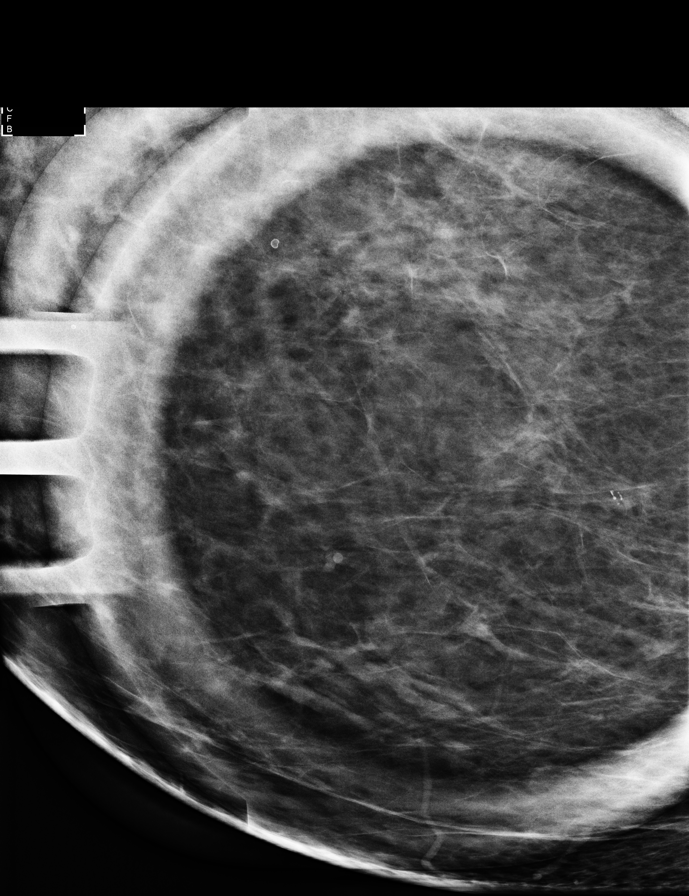

[R ML]
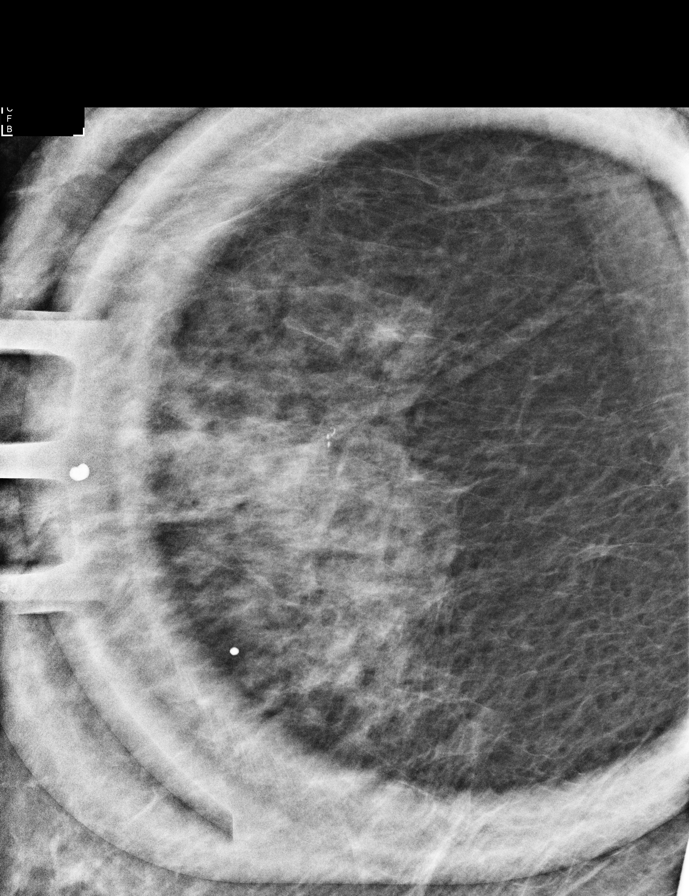

[R MLO synth-2D]
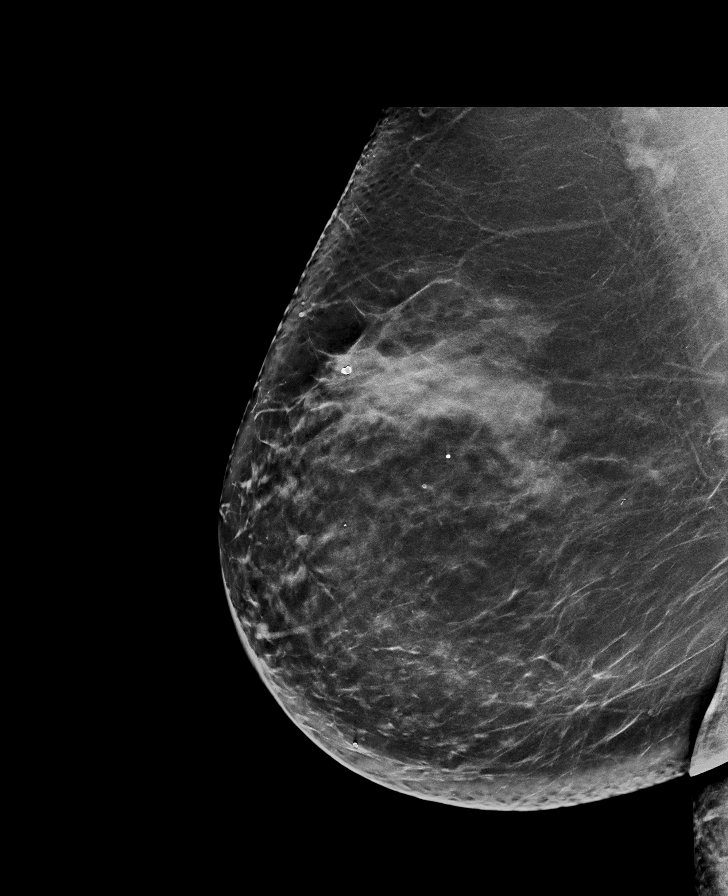

[R CC (2 of 2)]
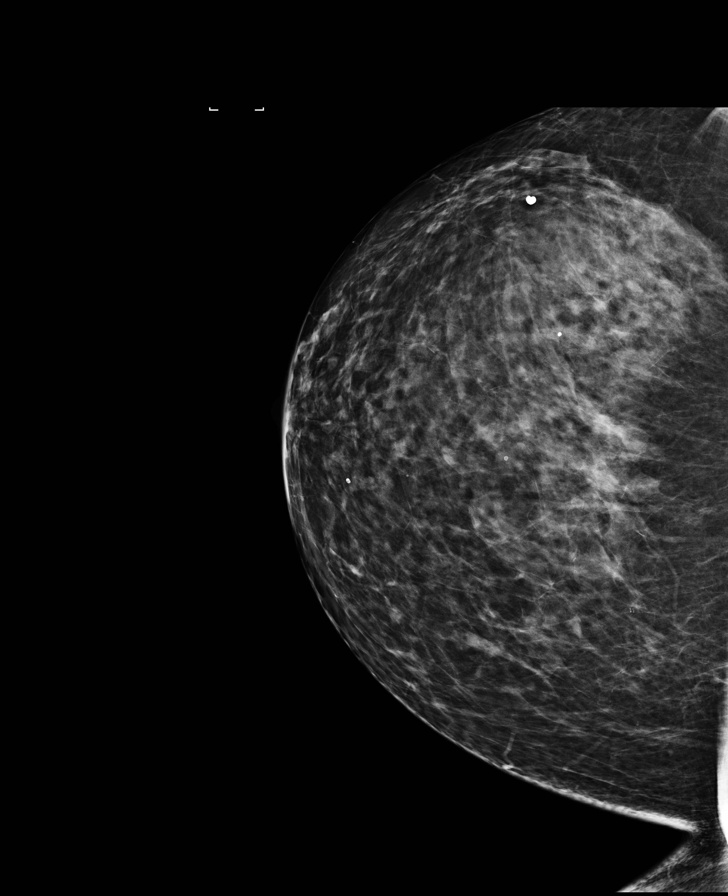

[L MLO]
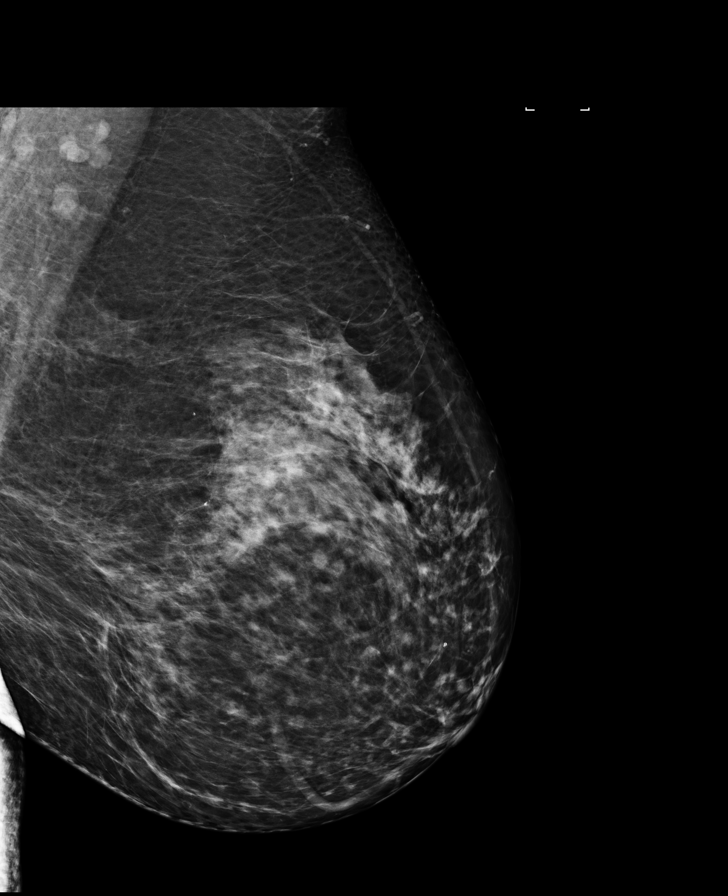

[R CC synth-2D]
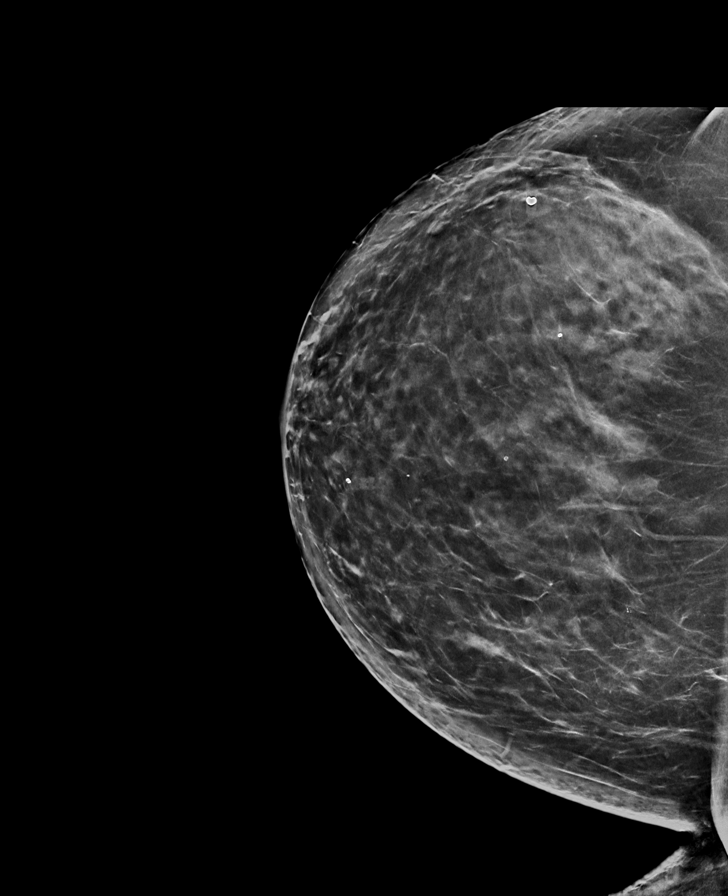

[L MLO synth-2D]
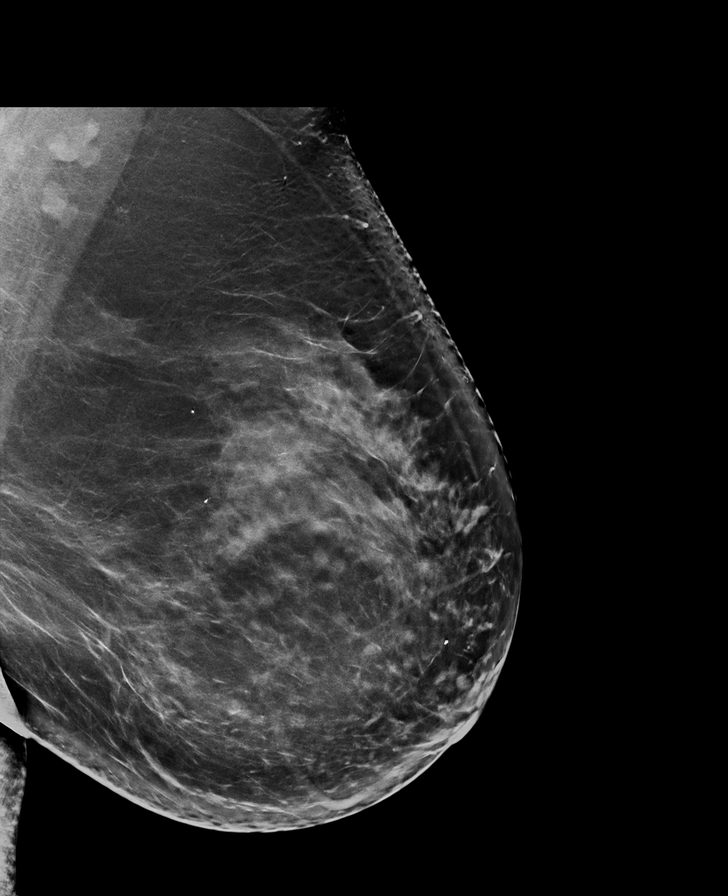

[R MLO]
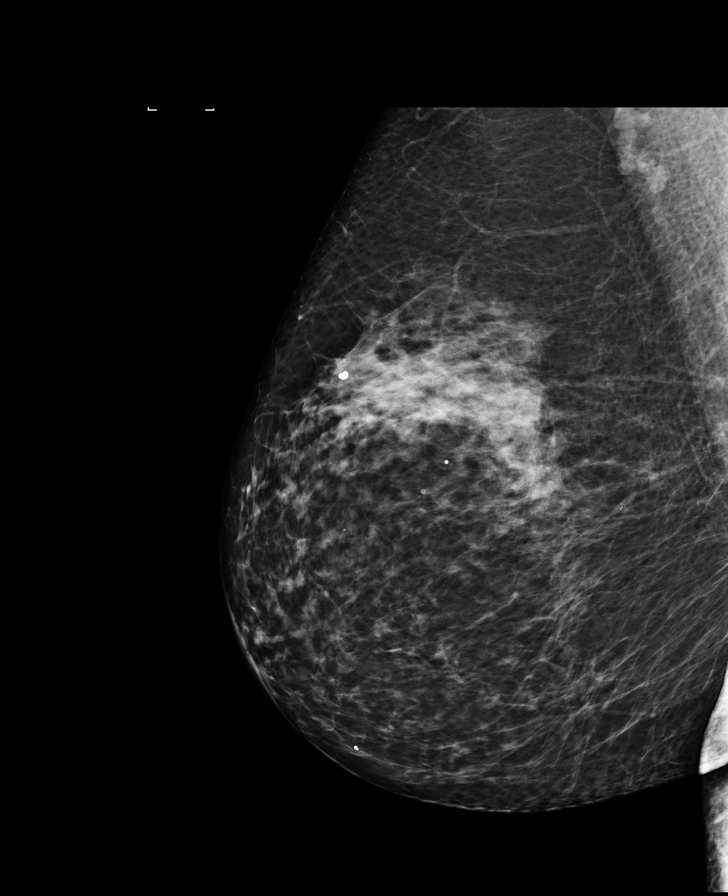

[8 of 30 positions shown; findings below may reference images not displayed]

ACR Breast Density Category c: The breast tissue is heterogeneously
dense, which may obscure small masses.
FINDINGS: The breast parenchymal pattern bilaterally is stable. The tiny group
of punctate calcifications located within the medial right breast
appears stable. There are no worrisome linear or branching forms.

Mammographic images were processed with CAD.
IMPRESSION: Stable tiny group of probably benign punctate calcifications within
the medial right breast. Recommend bilateral diagnostic mammography
in 1 year.

RECOMMENDATION:
Bilateral diagnostic mammography in 1 year.

I have discussed the findings and recommendations with the patient.
Results were also provided in writing at the conclusion of the
visit. If applicable, a reminder letter will be sent to the patient
regarding the next appointment.

BI-RADS CATEGORY  3: Probably benign.

## 2018-08-01 DIAGNOSIS — H25811 Combined forms of age-related cataract, right eye: Secondary | ICD-10-CM | POA: Diagnosis not present

## 2018-08-01 DIAGNOSIS — H2511 Age-related nuclear cataract, right eye: Secondary | ICD-10-CM | POA: Diagnosis not present

## 2018-08-02 DIAGNOSIS — H2512 Age-related nuclear cataract, left eye: Secondary | ICD-10-CM | POA: Diagnosis not present

## 2018-08-08 DIAGNOSIS — H2511 Age-related nuclear cataract, right eye: Secondary | ICD-10-CM | POA: Diagnosis not present

## 2018-08-08 DIAGNOSIS — H2512 Age-related nuclear cataract, left eye: Secondary | ICD-10-CM | POA: Diagnosis not present

## 2018-08-08 DIAGNOSIS — H25812 Combined forms of age-related cataract, left eye: Secondary | ICD-10-CM | POA: Diagnosis not present

## 2018-08-23 DIAGNOSIS — D510 Vitamin B12 deficiency anemia due to intrinsic factor deficiency: Secondary | ICD-10-CM | POA: Diagnosis not present

## 2018-09-27 DIAGNOSIS — D510 Vitamin B12 deficiency anemia due to intrinsic factor deficiency: Secondary | ICD-10-CM | POA: Diagnosis not present

## 2018-11-01 DIAGNOSIS — E538 Deficiency of other specified B group vitamins: Secondary | ICD-10-CM | POA: Diagnosis not present

## 2018-11-27 DIAGNOSIS — H26492 Other secondary cataract, left eye: Secondary | ICD-10-CM | POA: Diagnosis not present

## 2018-11-27 DIAGNOSIS — Z961 Presence of intraocular lens: Secondary | ICD-10-CM | POA: Diagnosis not present

## 2018-11-27 DIAGNOSIS — H04123 Dry eye syndrome of bilateral lacrimal glands: Secondary | ICD-10-CM | POA: Diagnosis not present

## 2018-11-27 DIAGNOSIS — H16223 Keratoconjunctivitis sicca, not specified as Sjogren's, bilateral: Secondary | ICD-10-CM | POA: Diagnosis not present

## 2018-11-27 DIAGNOSIS — H26493 Other secondary cataract, bilateral: Secondary | ICD-10-CM | POA: Diagnosis not present

## 2018-11-29 DIAGNOSIS — E6609 Other obesity due to excess calories: Secondary | ICD-10-CM | POA: Diagnosis not present

## 2018-11-29 DIAGNOSIS — Z0001 Encounter for general adult medical examination with abnormal findings: Secondary | ICD-10-CM | POA: Diagnosis not present

## 2018-11-29 DIAGNOSIS — Z0000 Encounter for general adult medical examination without abnormal findings: Secondary | ICD-10-CM | POA: Diagnosis not present

## 2018-11-29 DIAGNOSIS — Z13220 Encounter for screening for lipoid disorders: Secondary | ICD-10-CM | POA: Diagnosis not present

## 2018-11-29 DIAGNOSIS — Z6833 Body mass index (BMI) 33.0-33.9, adult: Secondary | ICD-10-CM | POA: Diagnosis not present

## 2018-11-29 DIAGNOSIS — Z1389 Encounter for screening for other disorder: Secondary | ICD-10-CM | POA: Diagnosis not present

## 2018-12-06 DIAGNOSIS — Z23 Encounter for immunization: Secondary | ICD-10-CM | POA: Diagnosis not present

## 2018-12-06 DIAGNOSIS — R7309 Other abnormal glucose: Secondary | ICD-10-CM | POA: Diagnosis not present

## 2018-12-06 DIAGNOSIS — E538 Deficiency of other specified B group vitamins: Secondary | ICD-10-CM | POA: Diagnosis not present

## 2018-12-25 DIAGNOSIS — J209 Acute bronchitis, unspecified: Secondary | ICD-10-CM | POA: Diagnosis not present

## 2018-12-25 DIAGNOSIS — E6609 Other obesity due to excess calories: Secondary | ICD-10-CM | POA: Diagnosis not present

## 2018-12-25 DIAGNOSIS — Z6834 Body mass index (BMI) 34.0-34.9, adult: Secondary | ICD-10-CM | POA: Diagnosis not present

## 2019-01-10 DIAGNOSIS — E538 Deficiency of other specified B group vitamins: Secondary | ICD-10-CM | POA: Diagnosis not present

## 2019-02-12 DIAGNOSIS — Z961 Presence of intraocular lens: Secondary | ICD-10-CM | POA: Diagnosis not present

## 2019-02-12 DIAGNOSIS — H26491 Other secondary cataract, right eye: Secondary | ICD-10-CM | POA: Diagnosis not present

## 2019-02-21 DIAGNOSIS — E538 Deficiency of other specified B group vitamins: Secondary | ICD-10-CM | POA: Diagnosis not present

## 2019-03-28 DIAGNOSIS — E538 Deficiency of other specified B group vitamins: Secondary | ICD-10-CM | POA: Diagnosis not present

## 2019-05-02 DIAGNOSIS — E538 Deficiency of other specified B group vitamins: Secondary | ICD-10-CM | POA: Diagnosis not present

## 2019-05-30 DIAGNOSIS — E538 Deficiency of other specified B group vitamins: Secondary | ICD-10-CM | POA: Diagnosis not present

## 2019-07-06 DIAGNOSIS — M542 Cervicalgia: Secondary | ICD-10-CM | POA: Diagnosis not present

## 2019-07-06 DIAGNOSIS — M79601 Pain in right arm: Secondary | ICD-10-CM | POA: Diagnosis not present

## 2019-07-06 DIAGNOSIS — R200 Anesthesia of skin: Secondary | ICD-10-CM | POA: Diagnosis not present

## 2019-07-19 DIAGNOSIS — M47812 Spondylosis without myelopathy or radiculopathy, cervical region: Secondary | ICD-10-CM | POA: Diagnosis not present

## 2019-07-19 DIAGNOSIS — M4802 Spinal stenosis, cervical region: Secondary | ICD-10-CM | POA: Diagnosis not present

## 2019-07-19 DIAGNOSIS — M50223 Other cervical disc displacement at C6-C7 level: Secondary | ICD-10-CM | POA: Diagnosis not present

## 2019-07-19 DIAGNOSIS — M79601 Pain in right arm: Secondary | ICD-10-CM | POA: Diagnosis not present

## 2019-07-25 DIAGNOSIS — E538 Deficiency of other specified B group vitamins: Secondary | ICD-10-CM | POA: Diagnosis not present

## 2019-08-10 DIAGNOSIS — M4722 Other spondylosis with radiculopathy, cervical region: Secondary | ICD-10-CM | POA: Diagnosis not present

## 2019-09-05 DIAGNOSIS — Z6835 Body mass index (BMI) 35.0-35.9, adult: Secondary | ICD-10-CM | POA: Diagnosis not present

## 2019-09-05 DIAGNOSIS — Z0000 Encounter for general adult medical examination without abnormal findings: Secondary | ICD-10-CM | POA: Diagnosis not present

## 2019-09-05 DIAGNOSIS — Z1389 Encounter for screening for other disorder: Secondary | ICD-10-CM | POA: Diagnosis not present

## 2019-09-05 DIAGNOSIS — E6609 Other obesity due to excess calories: Secondary | ICD-10-CM | POA: Diagnosis not present

## 2019-10-02 ENCOUNTER — Other Ambulatory Visit: Payer: Self-pay

## 2019-10-17 DIAGNOSIS — S86912A Strain of unspecified muscle(s) and tendon(s) at lower leg level, left leg, initial encounter: Secondary | ICD-10-CM | POA: Diagnosis not present

## 2019-10-17 DIAGNOSIS — Z6834 Body mass index (BMI) 34.0-34.9, adult: Secondary | ICD-10-CM | POA: Diagnosis not present

## 2019-10-17 DIAGNOSIS — E6609 Other obesity due to excess calories: Secondary | ICD-10-CM | POA: Diagnosis not present

## 2019-11-02 DIAGNOSIS — E7800 Pure hypercholesterolemia, unspecified: Secondary | ICD-10-CM | POA: Diagnosis not present

## 2019-11-02 DIAGNOSIS — R7301 Impaired fasting glucose: Secondary | ICD-10-CM | POA: Diagnosis not present

## 2019-11-02 DIAGNOSIS — R635 Abnormal weight gain: Secondary | ICD-10-CM | POA: Diagnosis not present

## 2019-11-21 DIAGNOSIS — E538 Deficiency of other specified B group vitamins: Secondary | ICD-10-CM | POA: Diagnosis not present

## 2020-01-30 DIAGNOSIS — E538 Deficiency of other specified B group vitamins: Secondary | ICD-10-CM | POA: Diagnosis not present

## 2020-03-05 DIAGNOSIS — D519 Vitamin B12 deficiency anemia, unspecified: Secondary | ICD-10-CM | POA: Diagnosis not present

## 2020-04-16 DIAGNOSIS — E538 Deficiency of other specified B group vitamins: Secondary | ICD-10-CM | POA: Diagnosis not present

## 2020-05-06 DIAGNOSIS — L905 Scar conditions and fibrosis of skin: Secondary | ICD-10-CM | POA: Diagnosis not present

## 2020-05-06 DIAGNOSIS — L820 Inflamed seborrheic keratosis: Secondary | ICD-10-CM | POA: Diagnosis not present

## 2020-05-06 DIAGNOSIS — D225 Melanocytic nevi of trunk: Secondary | ICD-10-CM | POA: Diagnosis not present

## 2020-05-06 DIAGNOSIS — Z1283 Encounter for screening for malignant neoplasm of skin: Secondary | ICD-10-CM | POA: Diagnosis not present

## 2020-06-04 DIAGNOSIS — D510 Vitamin B12 deficiency anemia due to intrinsic factor deficiency: Secondary | ICD-10-CM | POA: Diagnosis not present

## 2020-07-23 DIAGNOSIS — E538 Deficiency of other specified B group vitamins: Secondary | ICD-10-CM | POA: Diagnosis not present

## 2020-08-18 ENCOUNTER — Other Ambulatory Visit: Payer: Self-pay | Admitting: Family Medicine

## 2020-08-18 DIAGNOSIS — Z1231 Encounter for screening mammogram for malignant neoplasm of breast: Secondary | ICD-10-CM

## 2020-08-21 ENCOUNTER — Other Ambulatory Visit: Payer: Self-pay

## 2020-08-21 ENCOUNTER — Ambulatory Visit
Admission: RE | Admit: 2020-08-21 | Discharge: 2020-08-21 | Disposition: A | Discharge status: Home / Self Care | Payer: PPO | Source: Ambulatory Visit | Attending: Family Medicine | Admitting: Family Medicine

## 2020-08-21 DIAGNOSIS — Z1231 Encounter for screening mammogram for malignant neoplasm of breast: Secondary | ICD-10-CM | POA: Diagnosis not present

## 2020-09-03 DIAGNOSIS — E538 Deficiency of other specified B group vitamins: Secondary | ICD-10-CM | POA: Diagnosis not present

## 2020-10-08 DIAGNOSIS — Z6836 Body mass index (BMI) 36.0-36.9, adult: Secondary | ICD-10-CM | POA: Diagnosis not present

## 2020-10-08 DIAGNOSIS — H00014 Hordeolum externum left upper eyelid: Secondary | ICD-10-CM | POA: Diagnosis not present

## 2020-10-08 DIAGNOSIS — Z0001 Encounter for general adult medical examination with abnormal findings: Secondary | ICD-10-CM | POA: Diagnosis not present

## 2020-10-08 DIAGNOSIS — E6609 Other obesity due to excess calories: Secondary | ICD-10-CM | POA: Diagnosis not present

## 2020-10-08 DIAGNOSIS — Z1389 Encounter for screening for other disorder: Secondary | ICD-10-CM | POA: Diagnosis not present

## 2020-10-08 DIAGNOSIS — Z1331 Encounter for screening for depression: Secondary | ICD-10-CM | POA: Diagnosis not present

## 2020-10-15 DIAGNOSIS — E538 Deficiency of other specified B group vitamins: Secondary | ICD-10-CM | POA: Diagnosis not present

## 2020-11-26 DIAGNOSIS — E538 Deficiency of other specified B group vitamins: Secondary | ICD-10-CM | POA: Diagnosis not present

## 2020-12-30 DIAGNOSIS — E538 Deficiency of other specified B group vitamins: Secondary | ICD-10-CM | POA: Diagnosis not present

## 2021-02-04 DIAGNOSIS — E538 Deficiency of other specified B group vitamins: Secondary | ICD-10-CM | POA: Diagnosis not present

## 2021-03-25 DIAGNOSIS — E538 Deficiency of other specified B group vitamins: Secondary | ICD-10-CM | POA: Diagnosis not present

## 2021-04-17 DIAGNOSIS — G5711 Meralgia paresthetica, right lower limb: Secondary | ICD-10-CM | POA: Diagnosis not present

## 2021-04-29 DIAGNOSIS — E538 Deficiency of other specified B group vitamins: Secondary | ICD-10-CM | POA: Diagnosis not present

## 2021-05-27 DIAGNOSIS — E538 Deficiency of other specified B group vitamins: Secondary | ICD-10-CM | POA: Diagnosis not present

## 2021-07-02 DIAGNOSIS — E538 Deficiency of other specified B group vitamins: Secondary | ICD-10-CM | POA: Diagnosis not present

## 2021-07-07 DIAGNOSIS — R635 Abnormal weight gain: Secondary | ICD-10-CM | POA: Diagnosis not present

## 2021-07-23 DIAGNOSIS — R635 Abnormal weight gain: Secondary | ICD-10-CM | POA: Diagnosis not present

## 2021-08-06 DIAGNOSIS — E6609 Other obesity due to excess calories: Secondary | ICD-10-CM | POA: Diagnosis not present

## 2021-08-06 DIAGNOSIS — Z0001 Encounter for general adult medical examination with abnormal findings: Secondary | ICD-10-CM | POA: Diagnosis not present

## 2021-08-06 DIAGNOSIS — J40 Bronchitis, not specified as acute or chronic: Secondary | ICD-10-CM | POA: Diagnosis not present

## 2021-08-06 DIAGNOSIS — J329 Chronic sinusitis, unspecified: Secondary | ICD-10-CM | POA: Diagnosis not present

## 2021-08-06 DIAGNOSIS — R7309 Other abnormal glucose: Secondary | ICD-10-CM | POA: Diagnosis not present

## 2021-08-06 DIAGNOSIS — Z6837 Body mass index (BMI) 37.0-37.9, adult: Secondary | ICD-10-CM | POA: Diagnosis not present

## 2021-08-06 DIAGNOSIS — E538 Deficiency of other specified B group vitamins: Secondary | ICD-10-CM | POA: Diagnosis not present

## 2021-08-06 DIAGNOSIS — E039 Hypothyroidism, unspecified: Secondary | ICD-10-CM | POA: Diagnosis not present

## 2021-08-06 DIAGNOSIS — Z1331 Encounter for screening for depression: Secondary | ICD-10-CM | POA: Diagnosis not present

## 2021-08-11 ENCOUNTER — Other Ambulatory Visit: Payer: Self-pay | Admitting: Family Medicine

## 2021-08-11 DIAGNOSIS — Z1231 Encounter for screening mammogram for malignant neoplasm of breast: Secondary | ICD-10-CM

## 2021-08-24 ENCOUNTER — Ambulatory Visit
Admission: RE | Admit: 2021-08-24 | Discharge: 2021-08-24 | Disposition: A | Discharge status: Home / Self Care | Payer: PPO | Source: Ambulatory Visit | Attending: Family Medicine | Admitting: Family Medicine

## 2021-08-24 DIAGNOSIS — Z1231 Encounter for screening mammogram for malignant neoplasm of breast: Secondary | ICD-10-CM | POA: Diagnosis not present

## 2021-09-09 DIAGNOSIS — E538 Deficiency of other specified B group vitamins: Secondary | ICD-10-CM | POA: Diagnosis not present

## 2021-10-14 DIAGNOSIS — E538 Deficiency of other specified B group vitamins: Secondary | ICD-10-CM | POA: Diagnosis not present

## 2021-10-22 DIAGNOSIS — R635 Abnormal weight gain: Secondary | ICD-10-CM | POA: Diagnosis not present

## 2021-10-22 DIAGNOSIS — E039 Hypothyroidism, unspecified: Secondary | ICD-10-CM | POA: Diagnosis not present

## 2021-11-25 DIAGNOSIS — E538 Deficiency of other specified B group vitamins: Secondary | ICD-10-CM | POA: Diagnosis not present

## 2021-12-30 DIAGNOSIS — E538 Deficiency of other specified B group vitamins: Secondary | ICD-10-CM | POA: Diagnosis not present

## 2022-01-27 DIAGNOSIS — E039 Hypothyroidism, unspecified: Secondary | ICD-10-CM | POA: Diagnosis not present

## 2022-01-27 DIAGNOSIS — E8881 Metabolic syndrome: Secondary | ICD-10-CM | POA: Diagnosis not present

## 2022-02-02 DIAGNOSIS — E538 Deficiency of other specified B group vitamins: Secondary | ICD-10-CM | POA: Diagnosis not present

## 2022-02-05 DIAGNOSIS — E039 Hypothyroidism, unspecified: Secondary | ICD-10-CM | POA: Diagnosis not present

## 2022-02-05 DIAGNOSIS — R635 Abnormal weight gain: Secondary | ICD-10-CM | POA: Diagnosis not present

## 2022-03-19 DIAGNOSIS — E538 Deficiency of other specified B group vitamins: Secondary | ICD-10-CM | POA: Diagnosis not present

## 2022-04-22 DIAGNOSIS — E538 Deficiency of other specified B group vitamins: Secondary | ICD-10-CM | POA: Diagnosis not present

## 2022-05-25 DIAGNOSIS — E538 Deficiency of other specified B group vitamins: Secondary | ICD-10-CM | POA: Diagnosis not present

## 2022-06-01 DIAGNOSIS — E039 Hypothyroidism, unspecified: Secondary | ICD-10-CM | POA: Diagnosis not present

## 2022-06-01 DIAGNOSIS — R635 Abnormal weight gain: Secondary | ICD-10-CM | POA: Diagnosis not present

## 2022-06-07 DIAGNOSIS — E039 Hypothyroidism, unspecified: Secondary | ICD-10-CM | POA: Diagnosis not present

## 2022-06-07 DIAGNOSIS — R7301 Impaired fasting glucose: Secondary | ICD-10-CM | POA: Diagnosis not present

## 2022-06-07 DIAGNOSIS — E8881 Metabolic syndrome: Secondary | ICD-10-CM | POA: Diagnosis not present

## 2022-06-07 DIAGNOSIS — R635 Abnormal weight gain: Secondary | ICD-10-CM | POA: Diagnosis not present

## 2022-07-06 DIAGNOSIS — E538 Deficiency of other specified B group vitamins: Secondary | ICD-10-CM | POA: Diagnosis not present

## 2022-07-21 DIAGNOSIS — Z0001 Encounter for general adult medical examination with abnormal findings: Secondary | ICD-10-CM | POA: Diagnosis not present

## 2022-07-21 DIAGNOSIS — Z6835 Body mass index (BMI) 35.0-35.9, adult: Secondary | ICD-10-CM | POA: Diagnosis not present

## 2022-07-21 DIAGNOSIS — Z9229 Personal history of other drug therapy: Secondary | ICD-10-CM | POA: Diagnosis not present

## 2022-07-21 DIAGNOSIS — E6609 Other obesity due to excess calories: Secondary | ICD-10-CM | POA: Diagnosis not present

## 2022-07-21 DIAGNOSIS — E538 Deficiency of other specified B group vitamins: Secondary | ICD-10-CM | POA: Diagnosis not present

## 2022-07-21 DIAGNOSIS — Z1331 Encounter for screening for depression: Secondary | ICD-10-CM | POA: Diagnosis not present

## 2022-07-21 DIAGNOSIS — R7309 Other abnormal glucose: Secondary | ICD-10-CM | POA: Diagnosis not present

## 2022-07-28 ENCOUNTER — Other Ambulatory Visit (HOSPITAL_BASED_OUTPATIENT_CLINIC_OR_DEPARTMENT_OTHER): Payer: Self-pay | Admitting: Internal Medicine

## 2022-07-28 DIAGNOSIS — E785 Hyperlipidemia, unspecified: Secondary | ICD-10-CM

## 2022-08-04 ENCOUNTER — Encounter (HOSPITAL_BASED_OUTPATIENT_CLINIC_OR_DEPARTMENT_OTHER): Payer: Self-pay

## 2022-08-04 ENCOUNTER — Ambulatory Visit (HOSPITAL_BASED_OUTPATIENT_CLINIC_OR_DEPARTMENT_OTHER)
Admission: RE | Admit: 2022-08-04 | Discharge: 2022-08-04 | Disposition: A | Discharge status: Home / Self Care | Payer: PPO | Source: Ambulatory Visit | Attending: Internal Medicine | Admitting: Internal Medicine

## 2022-08-04 DIAGNOSIS — E785 Hyperlipidemia, unspecified: Secondary | ICD-10-CM | POA: Diagnosis not present

## 2022-08-04 DIAGNOSIS — J329 Chronic sinusitis, unspecified: Secondary | ICD-10-CM | POA: Diagnosis not present

## 2022-08-10 DIAGNOSIS — E538 Deficiency of other specified B group vitamins: Secondary | ICD-10-CM | POA: Diagnosis not present

## 2022-09-14 DIAGNOSIS — E538 Deficiency of other specified B group vitamins: Secondary | ICD-10-CM | POA: Diagnosis not present

## 2022-11-03 DIAGNOSIS — E538 Deficiency of other specified B group vitamins: Secondary | ICD-10-CM | POA: Diagnosis not present

## 2022-12-07 DIAGNOSIS — E538 Deficiency of other specified B group vitamins: Secondary | ICD-10-CM | POA: Diagnosis not present

## 2022-12-07 DIAGNOSIS — R635 Abnormal weight gain: Secondary | ICD-10-CM | POA: Diagnosis not present

## 2022-12-07 DIAGNOSIS — E039 Hypothyroidism, unspecified: Secondary | ICD-10-CM | POA: Diagnosis not present

## 2023-01-11 DIAGNOSIS — R635 Abnormal weight gain: Secondary | ICD-10-CM | POA: Diagnosis not present

## 2023-01-11 DIAGNOSIS — E8881 Metabolic syndrome: Secondary | ICD-10-CM | POA: Diagnosis not present

## 2023-01-11 DIAGNOSIS — E039 Hypothyroidism, unspecified: Secondary | ICD-10-CM | POA: Diagnosis not present

## 2023-01-20 DIAGNOSIS — L308 Other specified dermatitis: Secondary | ICD-10-CM | POA: Diagnosis not present

## 2023-01-26 DIAGNOSIS — E538 Deficiency of other specified B group vitamins: Secondary | ICD-10-CM | POA: Diagnosis not present

## 2023-02-11 DIAGNOSIS — Z1283 Encounter for screening for malignant neoplasm of skin: Secondary | ICD-10-CM | POA: Diagnosis not present

## 2023-02-11 DIAGNOSIS — D225 Melanocytic nevi of trunk: Secondary | ICD-10-CM | POA: Diagnosis not present

## 2023-02-11 DIAGNOSIS — L820 Inflamed seborrheic keratosis: Secondary | ICD-10-CM | POA: Diagnosis not present

## 2023-02-11 DIAGNOSIS — L821 Other seborrheic keratosis: Secondary | ICD-10-CM | POA: Diagnosis not present

## 2023-03-11 ENCOUNTER — Ambulatory Visit: Payer: PPO | Admitting: Adult Health

## 2023-03-29 DIAGNOSIS — E538 Deficiency of other specified B group vitamins: Secondary | ICD-10-CM | POA: Diagnosis not present

## 2023-04-07 ENCOUNTER — Other Ambulatory Visit: Payer: Self-pay | Admitting: Family Medicine

## 2023-04-07 DIAGNOSIS — Z1231 Encounter for screening mammogram for malignant neoplasm of breast: Secondary | ICD-10-CM

## 2023-04-13 DIAGNOSIS — E039 Hypothyroidism, unspecified: Secondary | ICD-10-CM | POA: Diagnosis not present

## 2023-04-13 DIAGNOSIS — R635 Abnormal weight gain: Secondary | ICD-10-CM | POA: Diagnosis not present

## 2023-04-20 DIAGNOSIS — R635 Abnormal weight gain: Secondary | ICD-10-CM | POA: Diagnosis not present

## 2023-04-20 DIAGNOSIS — E039 Hypothyroidism, unspecified: Secondary | ICD-10-CM | POA: Diagnosis not present

## 2023-04-20 DIAGNOSIS — E8881 Metabolic syndrome: Secondary | ICD-10-CM | POA: Diagnosis not present

## 2023-04-25 ENCOUNTER — Ambulatory Visit
Admission: RE | Admit: 2023-04-25 | Discharge: 2023-04-25 | Disposition: A | Discharge status: Home / Self Care | Payer: PPO | Source: Ambulatory Visit | Attending: Family Medicine | Admitting: Family Medicine

## 2023-04-25 DIAGNOSIS — Z1231 Encounter for screening mammogram for malignant neoplasm of breast: Secondary | ICD-10-CM | POA: Diagnosis not present

## 2023-05-16 DIAGNOSIS — E538 Deficiency of other specified B group vitamins: Secondary | ICD-10-CM | POA: Diagnosis not present

## 2023-07-01 DIAGNOSIS — E538 Deficiency of other specified B group vitamins: Secondary | ICD-10-CM | POA: Diagnosis not present

## 2023-07-18 DIAGNOSIS — L820 Inflamed seborrheic keratosis: Secondary | ICD-10-CM | POA: Diagnosis not present

## 2023-08-22 DIAGNOSIS — E538 Deficiency of other specified B group vitamins: Secondary | ICD-10-CM | POA: Diagnosis not present

## 2023-09-27 DIAGNOSIS — D518 Other vitamin B12 deficiency anemias: Secondary | ICD-10-CM | POA: Diagnosis not present

## 2023-09-27 DIAGNOSIS — E785 Hyperlipidemia, unspecified: Secondary | ICD-10-CM | POA: Diagnosis not present

## 2023-09-27 DIAGNOSIS — Z1331 Encounter for screening for depression: Secondary | ICD-10-CM | POA: Diagnosis not present

## 2023-09-27 DIAGNOSIS — E039 Hypothyroidism, unspecified: Secondary | ICD-10-CM | POA: Diagnosis not present

## 2023-09-27 DIAGNOSIS — Z0001 Encounter for general adult medical examination with abnormal findings: Secondary | ICD-10-CM | POA: Diagnosis not present

## 2023-09-27 DIAGNOSIS — E559 Vitamin D deficiency, unspecified: Secondary | ICD-10-CM | POA: Diagnosis not present

## 2023-09-27 DIAGNOSIS — G9332 Myalgic encephalomyelitis/chronic fatigue syndrome: Secondary | ICD-10-CM | POA: Diagnosis not present

## 2023-09-27 DIAGNOSIS — E6609 Other obesity due to excess calories: Secondary | ICD-10-CM | POA: Diagnosis not present

## 2023-09-27 DIAGNOSIS — R7309 Other abnormal glucose: Secondary | ICD-10-CM | POA: Diagnosis not present

## 2023-09-27 DIAGNOSIS — Z6833 Body mass index (BMI) 33.0-33.9, adult: Secondary | ICD-10-CM | POA: Diagnosis not present

## 2023-10-18 DIAGNOSIS — R635 Abnormal weight gain: Secondary | ICD-10-CM | POA: Diagnosis not present

## 2023-10-18 DIAGNOSIS — E039 Hypothyroidism, unspecified: Secondary | ICD-10-CM | POA: Diagnosis not present

## 2023-10-25 DIAGNOSIS — E039 Hypothyroidism, unspecified: Secondary | ICD-10-CM | POA: Diagnosis not present

## 2023-10-25 DIAGNOSIS — E8881 Metabolic syndrome: Secondary | ICD-10-CM | POA: Diagnosis not present

## 2023-10-25 DIAGNOSIS — R635 Abnormal weight gain: Secondary | ICD-10-CM | POA: Diagnosis not present

## 2023-11-01 DIAGNOSIS — D518 Other vitamin B12 deficiency anemias: Secondary | ICD-10-CM | POA: Diagnosis not present

## 2023-12-22 DIAGNOSIS — E538 Deficiency of other specified B group vitamins: Secondary | ICD-10-CM | POA: Diagnosis not present

## 2023-12-22 DIAGNOSIS — L309 Dermatitis, unspecified: Secondary | ICD-10-CM | POA: Diagnosis not present

## 2023-12-22 DIAGNOSIS — Z6832 Body mass index (BMI) 32.0-32.9, adult: Secondary | ICD-10-CM | POA: Diagnosis not present

## 2023-12-22 DIAGNOSIS — E6609 Other obesity due to excess calories: Secondary | ICD-10-CM | POA: Diagnosis not present

## 2024-01-04 DIAGNOSIS — L821 Other seborrheic keratosis: Secondary | ICD-10-CM | POA: Diagnosis not present

## 2024-01-04 DIAGNOSIS — L820 Inflamed seborrheic keratosis: Secondary | ICD-10-CM | POA: Diagnosis not present

## 2024-01-04 DIAGNOSIS — D225 Melanocytic nevi of trunk: Secondary | ICD-10-CM | POA: Diagnosis not present

## 2024-01-23 DIAGNOSIS — E538 Deficiency of other specified B group vitamins: Secondary | ICD-10-CM | POA: Diagnosis not present

## 2024-01-23 DIAGNOSIS — E66811 Obesity, class 1: Secondary | ICD-10-CM | POA: Diagnosis not present

## 2024-01-23 DIAGNOSIS — E782 Mixed hyperlipidemia: Secondary | ICD-10-CM | POA: Diagnosis not present

## 2024-01-23 DIAGNOSIS — E6609 Other obesity due to excess calories: Secondary | ICD-10-CM | POA: Diagnosis not present

## 2024-01-23 DIAGNOSIS — J0101 Acute recurrent maxillary sinusitis: Secondary | ICD-10-CM | POA: Diagnosis not present

## 2024-01-23 DIAGNOSIS — R0981 Nasal congestion: Secondary | ICD-10-CM | POA: Diagnosis not present

## 2024-01-23 DIAGNOSIS — Z6834 Body mass index (BMI) 34.0-34.9, adult: Secondary | ICD-10-CM | POA: Diagnosis not present

## 2024-02-01 DIAGNOSIS — E039 Hypothyroidism, unspecified: Secondary | ICD-10-CM | POA: Diagnosis not present
